# Patient Record
Sex: Female | Born: 1975 | Race: White | Hispanic: No | Marital: Married | Smoking: Former smoker
Health system: Southern US, Community
[De-identification: ages and names within clinical notes are randomized; demographics above are authoritative.]

## PROBLEM LIST (undated history)

## (undated) DIAGNOSIS — Z803 Family history of malignant neoplasm of breast: Secondary | ICD-10-CM

## (undated) DIAGNOSIS — Z923 Personal history of irradiation: Secondary | ICD-10-CM

## (undated) HISTORY — DX: Family history of malignant neoplasm of breast: Z80.3

## (undated) HISTORY — PX: WISDOM TOOTH EXTRACTION: SHX21

---

## 1998-10-05 ENCOUNTER — Inpatient Hospital Stay (HOSPITAL_COMMUNITY): Admission: AD | Admit: 1998-10-05 | Discharge: 1998-10-07 | Payer: Self-pay | Admitting: Obstetrics and Gynecology

## 1998-11-13 ENCOUNTER — Other Ambulatory Visit: Admission: RE | Admit: 1998-11-13 | Discharge: 1998-11-13 | Payer: Self-pay | Admitting: Obstetrics & Gynecology

## 1999-03-30 ENCOUNTER — Emergency Department (HOSPITAL_COMMUNITY): Admission: EM | Admit: 1999-03-30 | Discharge: 1999-03-30 | Payer: Self-pay | Admitting: *Deleted

## 1999-12-22 ENCOUNTER — Other Ambulatory Visit: Admission: RE | Admit: 1999-12-22 | Discharge: 1999-12-22 | Payer: Self-pay | Admitting: Obstetrics & Gynecology

## 2001-02-27 ENCOUNTER — Other Ambulatory Visit: Admission: RE | Admit: 2001-02-27 | Discharge: 2001-02-27 | Payer: Self-pay | Admitting: Obstetrics & Gynecology

## 2002-03-19 ENCOUNTER — Other Ambulatory Visit: Admission: RE | Admit: 2002-03-19 | Discharge: 2002-03-19 | Payer: Self-pay | Admitting: Obstetrics & Gynecology

## 2003-04-21 ENCOUNTER — Other Ambulatory Visit: Admission: RE | Admit: 2003-04-21 | Discharge: 2003-04-21 | Payer: Self-pay | Admitting: Obstetrics & Gynecology

## 2003-04-28 ENCOUNTER — Encounter: Admission: RE | Admit: 2003-04-28 | Discharge: 2003-04-28 | Payer: Self-pay | Admitting: Obstetrics & Gynecology

## 2004-06-29 ENCOUNTER — Other Ambulatory Visit: Admission: RE | Admit: 2004-06-29 | Discharge: 2004-06-29 | Payer: Self-pay | Admitting: Obstetrics & Gynecology

## 2007-12-31 ENCOUNTER — Encounter: Admission: RE | Admit: 2007-12-31 | Discharge: 2007-12-31 | Payer: Self-pay | Admitting: Obstetrics & Gynecology

## 2008-01-14 ENCOUNTER — Ambulatory Visit: Payer: Self-pay | Admitting: Pain Medicine

## 2008-09-08 ENCOUNTER — Encounter: Admission: RE | Admit: 2008-09-08 | Discharge: 2008-09-08 | Payer: Self-pay | Admitting: Obstetrics & Gynecology

## 2010-04-20 ENCOUNTER — Ambulatory Visit (HOSPITAL_COMMUNITY)
Admission: RE | Admit: 2010-04-20 | Payer: BC Managed Care – PPO | Source: Ambulatory Visit | Admitting: Obstetrics & Gynecology

## 2013-01-29 ENCOUNTER — Other Ambulatory Visit: Payer: Self-pay

## 2016-01-11 DIAGNOSIS — C50919 Malignant neoplasm of unspecified site of unspecified female breast: Secondary | ICD-10-CM

## 2016-01-11 HISTORY — DX: Malignant neoplasm of unspecified site of unspecified female breast: C50.919

## 2016-05-18 ENCOUNTER — Other Ambulatory Visit: Payer: Self-pay | Admitting: Obstetrics & Gynecology

## 2016-05-18 DIAGNOSIS — R928 Other abnormal and inconclusive findings on diagnostic imaging of breast: Secondary | ICD-10-CM

## 2016-05-23 ENCOUNTER — Ambulatory Visit
Admission: RE | Admit: 2016-05-23 | Discharge: 2016-05-23 | Disposition: A | Discharge status: Home / Self Care | Payer: BC Managed Care – PPO | Source: Ambulatory Visit | Attending: Obstetrics & Gynecology | Admitting: Obstetrics & Gynecology

## 2016-05-23 ENCOUNTER — Other Ambulatory Visit: Payer: Self-pay | Admitting: Obstetrics & Gynecology

## 2016-05-23 DIAGNOSIS — R928 Other abnormal and inconclusive findings on diagnostic imaging of breast: Secondary | ICD-10-CM

## 2016-05-23 DIAGNOSIS — N6489 Other specified disorders of breast: Secondary | ICD-10-CM

## 2016-05-25 ENCOUNTER — Telehealth: Payer: Self-pay | Admitting: *Deleted

## 2016-05-25 NOTE — Telephone Encounter (Signed)
Left vm regarding [place] for 5.23.18. Contact information provided.

## 2016-05-26 ENCOUNTER — Telehealth: Payer: Self-pay | Admitting: *Deleted

## 2016-05-26 NOTE — Telephone Encounter (Signed)
Left message for a return phone call to schedule for [facility] 5/23

## 2016-05-26 NOTE — Telephone Encounter (Signed)
Confirmed [facility] for 06/01/16 at 12:15pm .  Instructions and contact information given.

## 2016-05-31 ENCOUNTER — Other Ambulatory Visit: Payer: Self-pay | Admitting: *Deleted

## 2016-05-31 DIAGNOSIS — Z170 Estrogen receptor positive status [ER+]: Principal | ICD-10-CM

## 2016-05-31 DIAGNOSIS — C50511 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 female breast: Secondary | ICD-10-CM

## 2016-06-01 ENCOUNTER — Other Ambulatory Visit: Payer: Self-pay | Admitting: *Deleted

## 2016-06-01 ENCOUNTER — Encounter: Payer: Self-pay | Admitting: Radiation Oncology

## 2016-06-01 ENCOUNTER — Encounter: Payer: Self-pay | Admitting: Physical Therapy

## 2016-06-01 ENCOUNTER — Other Ambulatory Visit (HOSPITAL_BASED_OUTPATIENT_CLINIC_OR_DEPARTMENT_OTHER): Payer: BC Managed Care – PPO

## 2016-06-01 ENCOUNTER — Ambulatory Visit
Admission: RE | Admit: 2016-06-01 | Discharge: 2016-06-01 | Disposition: A | Discharge status: Home / Self Care | Payer: BC Managed Care – PPO | Source: Ambulatory Visit | Attending: Radiation Oncology | Admitting: Radiation Oncology

## 2016-06-01 ENCOUNTER — Ambulatory Visit: Payer: BC Managed Care – PPO | Attending: General Surgery | Admitting: Physical Therapy

## 2016-06-01 ENCOUNTER — Encounter: Payer: Self-pay | Admitting: Hematology and Oncology

## 2016-06-01 ENCOUNTER — Ambulatory Visit (HOSPITAL_BASED_OUTPATIENT_CLINIC_OR_DEPARTMENT_OTHER): Payer: BC Managed Care – PPO | Admitting: Hematology and Oncology

## 2016-06-01 DIAGNOSIS — C50511 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 female breast: Secondary | ICD-10-CM

## 2016-06-01 DIAGNOSIS — Z170 Estrogen receptor positive status [ER+]: Principal | ICD-10-CM

## 2016-06-01 DIAGNOSIS — R293 Abnormal posture: Secondary | ICD-10-CM | POA: Insufficient documentation

## 2016-06-01 LAB — CBC WITH DIFFERENTIAL/PLATELET
BASO%: 0.7 % (ref 0.0–2.0)
BASOS ABS: 0 10*3/uL (ref 0.0–0.1)
EOS ABS: 0.2 10*3/uL (ref 0.0–0.5)
EOS%: 2.5 % (ref 0.0–7.0)
HEMATOCRIT: 41.1 % (ref 34.8–46.6)
HEMOGLOBIN: 14 g/dL (ref 11.6–15.9)
LYMPH#: 1.6 10*3/uL (ref 0.9–3.3)
LYMPH%: 25.4 % (ref 14.0–49.7)
MCH: 30.6 pg (ref 25.1–34.0)
MCHC: 34.1 g/dL (ref 31.5–36.0)
MCV: 89.6 fL (ref 79.5–101.0)
MONO#: 0.5 10*3/uL (ref 0.1–0.9)
MONO%: 7.4 % (ref 0.0–14.0)
NEUT#: 4 10*3/uL (ref 1.5–6.5)
NEUT%: 64 % (ref 38.4–76.8)
PLATELETS: 233 10*3/uL (ref 145–400)
RBC: 4.59 10*6/uL (ref 3.70–5.45)
RDW: 12.9 % (ref 11.2–14.5)
WBC: 6.3 10*3/uL (ref 3.9–10.3)

## 2016-06-01 LAB — COMPREHENSIVE METABOLIC PANEL
ALBUMIN: 3.6 g/dL (ref 3.5–5.0)
ALK PHOS: 52 U/L (ref 40–150)
ALT: 13 U/L (ref 0–55)
ANION GAP: 6 meq/L (ref 3–11)
AST: 18 U/L (ref 5–34)
BUN: 11.2 mg/dL (ref 7.0–26.0)
CALCIUM: 8.9 mg/dL (ref 8.4–10.4)
CHLORIDE: 107 meq/L (ref 98–109)
CO2: 30 mEq/L — ABNORMAL HIGH (ref 22–29)
Creatinine: 1.1 mg/dL (ref 0.6–1.1)
EGFR: 59 mL/min/{1.73_m2} — AB (ref >90)
Glucose: 83 mg/dl (ref 70–140)
POTASSIUM: 4 meq/L (ref 3.5–5.1)
Sodium: 143 mEq/L (ref 136–145)
Total Bilirubin: 1.12 mg/dL (ref 0.20–1.20)
Total Protein: 7.2 g/dL (ref 6.4–8.3)

## 2016-06-01 MED ORDER — TAMOXIFEN CITRATE 20 MG PO TABS: 20.0000 mg | ORAL_TABLET | Freq: Every day | ORAL | 0 refills | Status: DC

## 2016-06-01 NOTE — Therapy (Signed)
[place], [place], [address] Phone: [phone]   Fax:  [phone]  Physical Therapy Evaluation  Patient Details  Name: Dorothy Crosby MRN: [id number] Date of Birth: 18-Jan-1975 Referring Provider: Dr. Rolm Bookbinder  Encounter Date: 06/01/2016      PT End of Session - 06/01/16 1651    Visit Number 1   Number of Visits 1   PT Start Time 6269   PT Stop Time 1505   PT Time Calculation (min) 21 min   Activity Tolerance Patient tolerated treatment well   Behavior During Therapy [facility] for tasks assessed/performed      History reviewed. No pertinent past medical history.  History reviewed. No pertinent surgical history.  There were no vitals filed for this visit.       Subjective Assessment - 06/01/16 1647    Subjective Patient reports she is here to be seen by her medical team for her newly diagnosed right breast cancer.   Patient is accompained by: Family member   Pertinent History Patient was diagnosed on 05/17/16 with right grade 2 invasive ductal carcinoma breast cancer. It measures 1.1 cm and is located in the lower outer quadrant. It is ER/PR positive and HER2 negative with a Ki67 of 10%.    Patient Stated Goals Reduce lymphedema risk and learn post op shoulder ROM HEP   Currently in Pain? No/denies            [facility] PT Assessment - 06/01/16 0001      Assessment   Medical Diagnosis Right breast cancer   Referring Provider Dr. Rolm Bookbinder   Onset Date/Surgical Date 05/17/16   Hand Dominance Right   Prior Therapy none     Precautions   Precautions Other (comment)   Precaution Comments active cancer     Restrictions   Weight Bearing Restrictions No     Balance Screen   Has the patient fallen in the past 6 months No   Has the patient had a decrease in activity level because of a fear of falling?  No   Is the patient reluctant to leave their home because of a fear of  falling?  No     Home Environment   Living Environment Private residence   Living Arrangements Spouse/significant other;Children  Husband and 17 y.o. 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   Leisure Cardio 2x/week for 30 minutes     Cognition   Overall Cognitive Status Within Functional Limits for tasks assessed     Posture/Postural Control   Posture/Postural Control Postural limitations   Postural Limitations Rounded Shoulders;Forward head     ROM / Strength   AROM / PROM / Strength AROM;Strength     AROM   AROM Assessment Site Shoulder;Cervical   Right/Left Shoulder Right;Left   Right Shoulder Extension 55 Degrees   Right Shoulder Flexion 140 Degrees   Right Shoulder ABduction 150 Degrees   Right Shoulder Internal Rotation 60 Degrees   Right Shoulder External Rotation 76 Degrees   Left Shoulder Extension 55 Degrees   Left Shoulder Flexion 146 Degrees   Left Shoulder ABduction 149 Degrees   Left Shoulder Internal Rotation 70 Degrees   Left Shoulder External Rotation 85 Degrees   Cervical Flexion WNL   Cervical Extension WNL  Cervical - Right Side Bend WNL   Cervical - Left Side Bend WNL   Cervical - Right Rotation WNL   Cervical - Left Rotation WNL     Strength   Overall Strength Within functional limits for tasks performed           LYMPHEDEMA/ONCOLOGY QUESTIONNAIRE - 06/01/16 1650      Type   Cancer Type Right breast cancer     Lymphedema Assessments   Lymphedema Assessments Upper extremities     Right Upper Extremity Lymphedema   10 cm Proximal to Olecranon Process 27.4 cm   Olecranon Process 24.3 cm   10 cm Proximal to Ulnar Styloid Process 21.5 cm   Just Proximal to Ulnar Styloid Process 15 cm   Across Hand at [employer] 18.6 cm   At [place] of 2nd Digit 6 cm      Left Upper Extremity Lymphedema   10 cm Proximal to Olecranon Process 27.5 cm   Olecranon Process 24.4 cm   10 cm Proximal to Ulnar Styloid Process 21 cm   Just Proximal to Ulnar Styloid Process 15.2 cm   Across Hand at [employer] 18.7 cm   At [place] of 2nd Digit 5.9 cm       Patient was instructed today in a home exercise program today for post op shoulder range of motion. These included active assist shoulder flexion in sitting, scapular retraction, wall walking with shoulder abduction, and hands behind head external rotation.  She was encouraged to do these twice a day, holding 3 seconds and repeating 5 times when permitted by her physician.          PT Education - 06/01/16 1651    Education provided Yes   Education Details Lymphedema risk reduction and post op shoulder ROM HEP   Person(s) Educated Patient;Spouse   Methods Explanation;Demonstration;Handout   Comprehension Returned demonstration;Verbalized understanding              Breast Clinic Goals - 06/01/16 1654      Patient will be able to verbalize understanding of pertinent lymphedema risk reduction practices relevant to her diagnosis specifically related to skin care.   Time 1   Period Days   Status Achieved     Patient will be able to return demonstrate and/or verbalize understanding of the post-op home exercise program related to regaining shoulder range of motion.   Time 1   Period Days   Status Achieved     Patient will be able to verbalize understanding of the importance of attending the postoperative After Breast Cancer Class for further lymphedema risk reduction education and therapeutic exercise.   Time 1   Period Days   Status Achieved              Plan - 06/01/16 1651    Clinical Impression Statement Patient was diagnosed on 05/17/16 with right grade 2 invasive ductal carcinoma breast cancer. It measures 1.1 cm and is located in the lower outer quadrant. It is ER/PR positive and  HER2 negative with a Ki67 of 10%.  Her multidisciplinary medical team met prior to her assessments to determine a recommended treatment plan. She is planning to have genetic testing, a right lumpectomy and sentinel node biopsy, Oncotype testing, radiation, and anti-estrogen therapy. She may benefit from post op PT to regain shoulder ROM and reduce lymphedema risk. Due to her lack of comorbidities, her eval is of low complexity.   Rehab Potential Excellent   Clinical Impairments  Affecting Rehab Potential None   PT Frequency One time visit   PT Treatment/Interventions Patient/family education;Therapeutic exercise   PT Next Visit Plan Will f/u after surgery to determine PT needs   PT Home Exercise Plan Post op shoulder ROM HEP   Consulted and Agree with Plan of Care Patient;Family member/caregiver   Family Member Consulted Husband      Patient will benefit from skilled therapeutic intervention in order to improve the following deficits and impairments:  Postural dysfunction, Decreased knowledge of precautions, Pain, Impaired UE functional use, Decreased range of motion  Visit Diagnosis: Carcinoma of lower-outer quadrant of right breast in female, estrogen receptor positive ([place]) - Plan: PT plan of care cert/re-cert  Abnormal posture - Plan: PT plan of care cert/re-cert   Patient will follow up at outpatient cancer rehab if needed following surgery.  If the patient requires physical therapy at that time, a specific plan will be dictated and sent to the referring physician for approval. The patient was educated today on appropriate basic range of motion exercises to begin post operatively and the importance of attending the After Breast Cancer class following surgery.  Patient was educated today on lymphedema risk reduction practices as it pertains to recommendations that will benefit the patient immediately following surgery.  She verbalized good understanding.  No additional physical therapy is  indicated at this time.     Problem List Patient Active Problem List   Diagnosis Date Noted  . 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 breast of female, estrogen receptor positive ([place]) 05/31/2016     Annia Friendly, PT 06/01/16 4:55 PM  [place], [place], [address] Phone: [phone]   Fax:  [phone]  Name: [facility] MRN: [id number] Date of Birth: 1975-05-11

## 2016-06-01 NOTE — Patient Instructions (Signed)

## 2016-06-01 NOTE — Assessment & Plan Note (Signed)
05/23/2016: Screening detected right breast distortion and left breast mass (fibroglandular tissue six-month follow-up recommended); ultrasound 1.1 cm right breast biopsy: Grade 2 invasive ductal carcinoma with DCIS, perineural invasion present, ER 70%, PR 90%, Ki-67 10%, HER-2 negative ratio 1.45, T1 BN 0 stage I a clinical stage  Pathology and radiology counseling:Discussed with the patient, the details of pathology including the type of breast cancer,the clinical staging, the significance of ER, PR and HER-2/neu receptors and the implications for treatment. After reviewing the pathology in detail, we proceeded to discuss the different treatment options between surgery, radiation, chemotherapy, antiestrogen therapies.  Recommendations: 1. Breast conserving surgery followed by 2. Oncotype DX testing to determine if chemotherapy would be of any benefit followed by 3. Adjuvant radiation therapy followed by 4. Adjuvant antiestrogen therapy  Oncotype counseling: I discussed Oncotype DX test. I explained to the patient that this is a 21 gene panel to evaluate patient tumors DNA to calculate recurrence score. This would help determine whether patient has high risk or intermediate risk or low risk breast cancer. She understands that if her tumor was found to be high risk, she would benefit from systemic chemotherapy. If low risk, no need of chemotherapy. If she was found to be intermediate risk, we would need to evaluate the score as well as other risk factors and determine if an abbreviated chemotherapy may be of benefit.  Return to clinic after surgery to discuss final pathology report and then determine if Oncotype DX testing will need to be sent.

## 2016-06-01 NOTE — Progress Notes (Signed)
Radiation Oncology         [phone] ________________________________  Initial Outpatient Consultation  Name: Dorothy Crosby [facility] MRN: [id number]  Date: 06/01/2016  DOB: 03/16/1975  HU:[id number], Audree Camel, MD  Rolm Bookbinder, MD   REFERRING PHYSICIAN: Rolm Bookbinder, MD  DIAGNOSIS: The encounter diagnosis was 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 breast of female, estrogen receptor positive ([place]).  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 female who was found to have right breast distortion and left breast mass (felt to be benign) on screening mammogram at the [place].  Biopsy of the right breast in the 6:30 o'clock position on 05/23/16 showed invasive mammary carcinoma with perineural invasion, grade 2, spanning 1.1 x 1.1 x 1.0 cm.  On review of systems, the patient is positive for fatigue, gum disease, poor circulation, joint pain, headaches, forgetfulness, and unspecified phobias.  PREVIOUS RADIATION THERAPY: No  PAST MEDICAL HISTORY:  has no past medical history on file.    PAST SURGICAL HISTORY:No past surgical history on file.  FAMILY HISTORY: family history includes Cancer in her maternal grandmother.  SOCIAL HISTORY:  reports that she quit smoking about 3 years ago. Her smoking use included Cigarettes. She smoked 1.00 pack per day. She has never used smokeless tobacco. She reports that she drinks alcohol. She reports that she does not use drugs.  ALLERGIES: Patient has no known allergies.  MEDICATIONS:  Current Outpatient Prescriptions  Medication Sig Dispense Refill  . BIOTIN 5000 PO Take 1 tablet by mouth daily.    . Lactobacillus (PROBIOTIC ACIDOPHILUS PO) Take 1 tablet by mouth daily.    . Multiple Vitamin (MULTIVITAMIN) tablet Take 1 tablet by mouth daily.    . tamoxifen (NOLVADEX) 20 MG tablet Take 1 tablet (20 mg total) by  mouth daily. 30 tablet 0   No current facility-administered medications for this encounter.     REVIEW OF SYSTEMS:  A 10+ POINT REVIEW OF SYSTEMS WAS OBTAINED including neurology, dermatology, psychiatry, cardiac, respiratory, lymph, extremities, GI, GU, musculoskeletal, constitutional, reproductive, HEENT. All pertinent positives are noted in the HPI. All others are negative.   PHYSICAL EXAM:  vitals were not taken for this visit.  Vitals with BMI 06/01/2016  Height 5' 4.5"  Weight 152 lbs 2 oz  BMI 35.4  Systolic 656  Diastolic 54  Pulse 89  Respirations 18  Lungs are clear to auscultation bilaterally. Heart has regular rate and rhythm. No palpable cervical, supraclavicular, or axillary adenopathy.  Left breast shows no palpable mass or nipple discharge. Right breast has some mild bruising in the lower aspect of the breast and a small biopsy site in the 5:30-6:00 o'clock position. No palpable mass or nipple discharge.   ECOG = 1   LABORATORY DATA:  Lab Results  Component Value Date   WBC 6.3 06/01/2016   HGB 14.0 06/01/2016   HCT 41.1 06/01/2016   MCV 89.6 06/01/2016   PLT 233 06/01/2016   NEUTROABS 4.0 06/01/2016   Lab Results  Component Value Date   NA 143 06/01/2016   K 4.0 06/01/2016   CO2 30 (H) 06/01/2016   GLUCOSE 83 06/01/2016   CREATININE 1.1 06/01/2016   CALCIUM 8.9 06/01/2016      RADIOGRAPHY: US Breast Ltd Uni Left Inc Axilla  Result Date: 05/23/2016 CLINICAL DATA:  41 year old patient recalled from recent screening mammogram for possible architectural distortion in  the right breast and possible mass in the left breast. She had a left breast ultrasound-guided biopsy December 2009, demonstrating benign breast parenchyma with stromal fibrosis. She has never had a surgical breast biopsy. History of breast cancer in her maternal grandmother. EXAM: 2D DIGITAL DIAGNOSTIC BILATERAL MAMMOGRAM WITH CAD AND ADJUNCT TOMO ULTRASOUND BILATERAL BREAST COMPARISON:  May 12, 2015 and earlier exams ACR Breast Density Category d: The breast tissue is extremely dense, which lowers the sensitivity of mammography. FINDINGS: On additional imaging of the left breast, an [place] of probable dense fibroglandular tissue seen in the medial left breast without a discrete mass or architectural distortion. In the right breast, there is persistent architectural distortion best seen in the CC projection, slightly lateral to the nipple, shown on tomography to be in the lower outer quadrant. On a 90 degree lateral view of the right breast with tomography, no definite architectural distortion or mass is appreciated. Mammographic images were processed with CAD. On physical exam, I do not palpated a mass in the medial left breast. In the lower outer quadrant of the right breast, I palpate a prominent ridge versus mass at approximately 6 o'clock to 6:30 position 2-3 cm from the nipple. Targeted ultrasound is performed, showing dense fibroglandular tissue in the medial left breast without focal suspicious mass or abnormal shadowing. Ultrasound of the right breast reveals an irregular, taller than wide, hypoechoic mass with associated vascular flow at 6:30 position 3 cm from the nipple. This mass measures 1.0 x 1.1 x 1.1 cm. Malignancy cannot be excluded. This mass is likely the cause of the architectural distortion seen on the mammogram. Ultrasound of the right axilla is negative for lymphadenopathy. IMPRESSION: Suspicious mass in the lower outer quadrant of the right breast. Probably benign dense fibroglandular tissue in the far medial left breast. RECOMMENDATION: 1. Ultrasound-guided biopsy of the right breast mass is recommended and will be performed later today and dictated separately. 2. Diagnostic left mammogram and possible left breast ultrasound is recommended in 6 months. I have discussed the findings and recommendations with the patient. Results were also provided in writing at the conclusion of the  visit. If applicable, a reminder letter will be sent to the patient regarding the next appointment. BI-RADS CATEGORY  4: Suspicious. Electronically Signed   By: Curlene Dolphin M.D.   On: 05/23/2016 11:07   US Breast Ltd Uni Right Inc Axilla  Result Date: 05/23/2016 CLINICAL DATA:  41 year old patient recalled from recent screening mammogram for possible architectural distortion in the right breast and possible mass in the left breast. She had a left breast ultrasound-guided biopsy December 2009, demonstrating benign breast parenchyma with stromal fibrosis. She has never had a surgical breast biopsy. History of breast cancer in her maternal grandmother. EXAM: 2D DIGITAL DIAGNOSTIC BILATERAL MAMMOGRAM WITH CAD AND ADJUNCT TOMO ULTRASOUND BILATERAL BREAST COMPARISON:  May 12, 2015 and earlier exams ACR Breast Density Category d: The breast tissue is extremely dense, which lowers the sensitivity of mammography. FINDINGS: On additional imaging of the left breast, an [place] of probable dense fibroglandular tissue seen in the medial left breast without a discrete mass or architectural distortion. In the right breast, there is persistent architectural distortion best seen in the CC projection, slightly lateral to the nipple, shown on tomography to be in the lower outer quadrant. On a 90 degree lateral view of the right breast with tomography, no definite architectural distortion or mass is appreciated. Mammographic images were processed with CAD. On physical exam, I  do not palpated a mass in the medial left breast. In the lower outer quadrant of the right breast, I palpate a prominent ridge versus mass at approximately 6 o'clock to 6:30 position 2-3 cm from the nipple. Targeted ultrasound is performed, showing dense fibroglandular tissue in the medial left breast without focal suspicious mass or abnormal shadowing. Ultrasound of the right breast reveals an irregular, taller than wide, hypoechoic mass with associated  vascular flow at 6:30 position 3 cm from the nipple. This mass measures 1.0 x 1.1 x 1.1 cm. Malignancy cannot be excluded. This mass is likely the cause of the architectural distortion seen on the mammogram. Ultrasound of the right axilla is negative for lymphadenopathy. IMPRESSION: Suspicious mass in the lower outer quadrant of the right breast. Probably benign dense fibroglandular tissue in the far medial left breast. RECOMMENDATION: 1. Ultrasound-guided biopsy of the right breast mass is recommended and will be performed later today and dictated separately. 2. Diagnostic left mammogram and possible left breast ultrasound is recommended in 6 months. I have discussed the findings and recommendations with the patient. Results were also provided in writing at the conclusion of the visit. If applicable, a reminder letter will be sent to the patient regarding the next appointment. BI-RADS CATEGORY  4: Suspicious. Electronically Signed   By: Curlene Dolphin M.D.   On: 05/23/2016 11:07   Mm Diag Breast Tomo Bilateral  Result Date: 05/23/2016 CLINICAL DATA:  41 year old patient recalled from recent screening mammogram for possible architectural distortion in the right breast and possible mass in the left breast. She had a left breast ultrasound-guided biopsy December 2009, demonstrating benign breast parenchyma with stromal fibrosis. She has never had a surgical breast biopsy. History of breast cancer in her maternal grandmother. EXAM: 2D DIGITAL DIAGNOSTIC BILATERAL MAMMOGRAM WITH CAD AND ADJUNCT TOMO ULTRASOUND BILATERAL BREAST COMPARISON:  May 12, 2015 and earlier exams ACR Breast Density Category d: The breast tissue is extremely dense, which lowers the sensitivity of mammography. FINDINGS: On additional imaging of the left breast, an [place] of probable dense fibroglandular tissue seen in the medial left breast without a discrete mass or architectural distortion. In the right breast, there is persistent architectural  distortion best seen in the CC projection, slightly lateral to the nipple, shown on tomography to be in the lower outer quadrant. On a 90 degree lateral view of the right breast with tomography, no definite architectural distortion or mass is appreciated. Mammographic images were processed with CAD. On physical exam, I do not palpated a mass in the medial left breast. In the lower outer quadrant of the right breast, I palpate a prominent ridge versus mass at approximately 6 o'clock to 6:30 position 2-3 cm from the nipple. Targeted ultrasound is performed, showing dense fibroglandular tissue in the medial left breast without focal suspicious mass or abnormal shadowing. Ultrasound of the right breast reveals an irregular, taller than wide, hypoechoic mass with associated vascular flow at 6:30 position 3 cm from the nipple. This mass measures 1.0 x 1.1 x 1.1 cm. Malignancy cannot be excluded. This mass is likely the cause of the architectural distortion seen on the mammogram. Ultrasound of the right axilla is negative for lymphadenopathy. IMPRESSION: Suspicious mass in the lower outer quadrant of the right breast. Probably benign dense fibroglandular tissue in the far medial left breast. RECOMMENDATION: 1. Ultrasound-guided biopsy of the right breast mass is recommended and will be performed later today and dictated separately. 2. Diagnostic left mammogram and possible left breast ultrasound  is recommended in 6 months. I have discussed the findings and recommendations with the patient. Results were also provided in writing at the conclusion of the visit. If applicable, a reminder letter will be sent to the patient regarding the next appointment. BI-RADS CATEGORY  4: Suspicious. Electronically Signed   By: Curlene Dolphin M.D.   On: 05/23/2016 11:07   Mm Clip Placement Right  Result Date: 05/23/2016 CLINICAL DATA:  Post biopsy mammogram of the right breast for clip placement. EXAM: DIAGNOSTIC RIGHT MAMMOGRAM POST  ULTRASOUND BIOPSY COMPARISON:  Previous exam(s). FINDINGS: Mammographic images were obtained following ultrasound guided biopsy of right breast mass at 6:30. The ribbon shaped biopsy marking clip is appropriately positioned at the site of the biopsied distorted mass at 6:30 in the right breast. IMPRESSION: Appropriate positioning of the ribbon shaped biopsy marking clip in the lower slightly outer quadrant of the right breast. Final Assessment: Post Procedure Mammograms for Marker Placement Electronically Signed   By: Ammie Ferrier M.D.   On: 05/23/2016 14:58   [place] Rt Breast Bx W Loc Dev 1st Lesion Img Bx Spec US Guide  Addendum Date: 05/25/2016   ADDENDUM REPORT: 05/25/2016 08:07 ADDENDUM: Pathology revealed grade II invasive ductal carcinoma with perineural invasion in the right breast. This was found to be concordant by Dr. Ammie Ferrier. Pathology results were discussed with the patient by telephone. The patient reported doing well after the biopsy. Post biopsy instructions and care were reviewed and questions were answered. The patient was encouraged to call [facility] for any additional concerns. The patient was referred to the [facility] at the [facility] on Jun 01, 2016. A bilateral breast MRI is recommended secondary to dense breast parenchyma in this patient with premenopausal cancer. Pathology results reported by Susa Raring RN, BSN on 05/25/2016. Electronically Signed   By: Ammie Ferrier M.D.   On: 05/25/2016 08:07   Result Date: 05/25/2016 CLINICAL DATA:  41 year old female presenting for ultrasound-guided biopsy of a right breast mass. EXAM: ULTRASOUND GUIDED RIGHT BREAST CORE NEEDLE BIOPSY COMPARISON:  Previous exam(s). FINDINGS: I met with the patient and we discussed the procedure of ultrasound-guided biopsy, including benefits and alternatives. We discussed the high likelihood of a successful procedure.  We discussed the risks of the procedure, including infection, bleeding, tissue injury, clip migration, and inadequate sampling. Informed written consent was given. The usual time-out protocol was performed immediately prior to the procedure. Lesion quadrant: Lower outer Using sterile technique and 1% Lidocaine as local anesthetic, under direct ultrasound visualization, a 14 gauge spring-loaded device was used to perform biopsy of a right breast mass at 6:30 using a medial approach. At the conclusion of the procedure a ribbon shaped tissue marker clip was deployed into the biopsy cavity. Follow up 2 view mammogram was performed and dictated separately. IMPRESSION: Ultrasound guided biopsy of right breast mass at 6:30. No apparent complications. Electronically Signed: By: Ammie Ferrier M.D. On: 05/23/2016 14:30      IMPRESSION: This is a 41 y.o. woman with Clinical stage T1c invasive ductal carcinoma of the right breast, grade 2. Patient is a good candidate for breast conservation therapy with radiation therapy directed at the right breast. I discussed the course of treatment, side effects, and potential toxicities of radiation therapy with the patient and her husband. She will likely receive her radiation therapy at the [facility] given her close proximity to this facility.  PLAN:  In light  of her family history and age she wishes to await results of genetic testing prior to deciding her surgical approach. At a later date the patient will be placed on adjuvant hormonal therapy with Tamoxifen. She will have an MRI for further evaluation.   ------------------------------------------------  Blair Promise, PhD, MD  This document serves as a record of services personally performed by Gery Pray, MD. It was created on his behalf by Maryla Morrow, a trained medical scribe. The creation of this record is based on the scribe's personal observations and the provider's statements to them. This  document has been checked and approved by the attending provider.

## 2016-06-01 NOTE — Progress Notes (Signed)
[facility] CONSULT NOTE  Patient Care Team: Hadley Pen, MD as PCP - General (Family Medicine) Emelia Loron, MD as Consulting Physician (General Surgery) Serena Croissant, MD as Consulting Physician (Hematology and Oncology) Antony Blackbird, MD as Consulting Physician (Radiation Oncology)  CHIEF COMPLAINTS/PURPOSE OF CONSULTATION:  Newly diagnosed breast cancer  HISTORY OF PRESENTING ILLNESS:  Dorothy Crosby 41 y.o. female is here because of recent diagnosis of right breast cancer. Patient has screening mammogram that revealed right breast distortion along with left breast mass which turned out to be fibroglandular changes. Left breast lesions will need to be followed by a six-month mammogram. The right breast distortion measures 1.1 cm by ultrasound. Biopsy future revealed grade 2 invasive ductal carcinoma with DCIS and perineural invasion that was ER/PR positive and HER-2 negative. She was presented this morning of the margins benign tumor board and she is here today to discuss the treatment plan.  I reviewed her records extensively and collaborated the history with the patient.  SUMMARY OF ONCOLOGIC HISTORY:   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 breast of female, estrogen receptor positive ([facility])   05/23/2016 Initial Diagnosis    Screening detected right breast distortion and left breast mass (fibroglandular tissue six-month follow-up recommended); ultrasound 1.1 cm right breast biopsy: Grade 2 invasive ductal carcinoma with DCIS, perineural invasion present, ER 70%, PR 90%, Ki-67 10%, HER-2 negative ratio 1.45, T1 BN 0 stage I a clinical stage       MEDICAL HISTORY:  No previous medical problems SURGICAL HISTORY: No prior surgeries SOCIAL HISTORY: Social History   Social History  . Marital status: Married    Spouse name: N/A  . Number of children: N/A  . Years of education: N/A   Occupational History  . Not on file.   Social History  Main Topics  . Smoking status: Former Smoker    Packs/day: 1.00    Types: Cigarettes    Quit date: 07/14/2012  . Smokeless tobacco: Not on file  . Alcohol use Yes  . Drug use: No  . Sexual activity: Not on file   Other Topics Concern  . Not on file   Social History Narrative  . No narrative on file    FAMILY HISTORY: Family History  Problem Relation Age of Onset  . Cancer Maternal Grandmother        breast    ALLERGIES:  has No Known Allergies.  MEDICATIONS:  Current Outpatient Prescriptions  Medication Sig Dispense Refill  . BIOTIN 5000 PO Take 1 tablet by mouth daily.    . Lactobacillus (PROBIOTIC ACIDOPHILUS PO) Take 1 tablet by mouth daily.    . Multiple Vitamin (MULTIVITAMIN) tablet Take 1 tablet by mouth daily.    . tamoxifen (NOLVADEX) 20 MG tablet Take 1 tablet (20 mg total) by mouth daily. 30 tablet 0   No current facility-administered medications for this visit.     REVIEW OF SYSTEMS:   Constitutional: Denies fevers, chills or abnormal night sweats Eyes: Denies blurriness of vision, double vision or watery eyes Ears, nose, mouth, throat, and face: Denies mucositis or sore throat Respiratory: Denies cough, dyspnea or wheezes Cardiovascular: Denies palpitation, chest discomfort or lower extremity swelling Gastrointestinal:  Denies nausea, heartburn or change in bowel habits Skin: Denies abnormal skin rashes Lymphatics: Denies new lymphadenopathy or easy bruising Neurological:Denies numbness, tingling or new weaknesses Behavioral/Psych: Mood is stable, no new changes  Breast:  Denies any palpable lumps or discharge All other systems were reviewed with  the patient and are negative.  PHYSICAL EXAMINATION: ECOG PERFORMANCE STATUS: 0 - Asymptomatic  Vitals:   06/01/16 1302  BP: (!) 102/54  Pulse: 89  Resp: 18  Temp: 97.6 F (36.4 C)   Filed Weights   06/01/16 1302  Weight: 152 lb 1.6 oz (69 kg)    GENERAL:alert, no distress and comfortable SKIN:  skin color, texture, turgor are normal, no rashes or significant lesions EYES: normal, conjunctiva are pink and non-injected, sclera clear OROPHARYNX:no exudate, no erythema and lips, buccal mucosa, and tongue normal  NECK: supple, thyroid normal size, non-tender, without nodularity LYMPH:  no palpable lymphadenopathy in the cervical, axillary or inguinal LUNGS: clear to auscultation and percussion with normal breathing effort HEART: regular rate & rhythm and no murmurs and no lower extremity edema ABDOMEN:abdomen soft, non-tender and normal bowel sounds Musculoskeletal:no cyanosis of digits and no clubbing  PSYCH: alert & oriented x 3 with fluent speech NEURO: no focal motor/sensory deficits BREAST: No palpable nodules in breast. No palpable axillary or supraclavicular lymphadenopathy (exam performed in the presence of a chaperone)   LABORATORY DATA:  I have reviewed the data as listed Lab Results  Component Value Date   WBC 6.3 06/01/2016   HGB 14.0 06/01/2016   HCT 41.1 06/01/2016   MCV 89.6 06/01/2016   PLT 233 06/01/2016   Lab Results  Component Value Date   NA 143 06/01/2016   K 4.0 06/01/2016   CO2 30 (H) 06/01/2016    RADIOGRAPHIC STUDIES: I have personally reviewed the radiological reports and agreed with the findings in the report.  ASSESSMENT AND PLAN:  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 breast of female, estrogen receptor positive ([place]) 05/23/2016: Screening detected right breast distortion and left breast mass (fibroglandular tissue six-month follow-up recommended); ultrasound 1.1 cm right breast biopsy: Grade 2 invasive ductal carcinoma with DCIS, perineural invasion present, ER 70%, PR 90%, Ki-67 10%, HER-2 negative ratio 1.45, T1 BN 0 stage I a clinical stage  Pathology and radiology counseling:Discussed with the patient, the details of pathology including the type of breast cancer,the clinical staging, the significance of ER, PR and HER-2/neu  receptors and the implications for treatment. After reviewing the pathology in detail, we proceeded to discuss the different treatment options between surgery, radiation, chemotherapy, antiestrogen therapies.  Recommendations: 1. Breast conserving surgery followed by 2. Oncotype DX testing to determine if chemotherapy would be of any benefit followed by 3. Adjuvant radiation therapy followed by 4. Adjuvant antiestrogen therapy  Oncotype counseling: I discussed Oncotype DX test. I explained to the patient that this is a 21 gene panel to evaluate patient tumors DNA to calculate recurrence score. This would help determine whether patient has high risk or intermediate risk or low risk breast cancer. She understands that if her tumor was found to be high risk, she would benefit from systemic chemotherapy. If low risk, no need of chemotherapy. If she was found to be intermediate risk, we would need to evaluate the score as well as other risk factors and determine if an abbreviated chemotherapy may be of benefit.  Return to clinic after surgery to discuss final pathology report and then determine if Oncotype DX testing will need to be sent.     All questions were answered. The patient knows to call the clinic with any problems, questions or concerns.    Rulon Eisenmenger, MD 06/01/16

## 2016-06-02 ENCOUNTER — Encounter: Payer: Self-pay | Admitting: Genetic Counselor

## 2016-06-02 ENCOUNTER — Other Ambulatory Visit: Payer: BC Managed Care – PPO

## 2016-06-02 ENCOUNTER — Ambulatory Visit (HOSPITAL_BASED_OUTPATIENT_CLINIC_OR_DEPARTMENT_OTHER): Payer: BC Managed Care – PPO | Admitting: Genetic Counselor

## 2016-06-02 ENCOUNTER — Encounter: Payer: Self-pay | Admitting: General Practice

## 2016-06-02 DIAGNOSIS — C50511 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 female breast: Secondary | ICD-10-CM

## 2016-06-02 DIAGNOSIS — Z170 Estrogen receptor positive status [ER+]: Secondary | ICD-10-CM

## 2016-06-02 DIAGNOSIS — Z803 Family history of malignant neoplasm of breast: Secondary | ICD-10-CM | POA: Insufficient documentation

## 2016-06-02 NOTE — Progress Notes (Signed)
REFERRING PROVIDER: Nicholas Lose, MD [address] [place], [place] [address]  PRIMARY PROVIDER:  Myrlene Broker, MD  PRIMARY REASON FOR VISIT:  1. 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 breast of female, estrogen receptor positive ([place])   2. Family history of breast cancer      HISTORY OF PRESENT ILLNESS:   [name], a 41 y.o. female, was seen for a [facility] cancer genetics consultation at the request of Dr. Lindi Adie due to a personal and family history of cancer.  [name] presents to clinic today to discuss the possibility of a hereditary predisposition to cancer, genetic testing, and to further clarify her future cancer risks, as well as potential cancer risks for family members.   In May 2018, at the age of 89, [name] was diagnosed with invasive ductal carcinoma of the right breast. This is being treated with tamoxifen, and she will be scheduled for surgery and radiation.  Chemotherapy will be dependant on an oncotype and genetic testing results.     CANCER HISTORY:    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 breast of female, estrogen receptor positive ([place])   05/23/2016 Initial Diagnosis    Screening detected right breast distortion and left breast mass (fibroglandular tissue six-month follow-up recommended); ultrasound 1.1 cm right breast biopsy: Grade 2 invasive ductal carcinoma with DCIS, perineural invasion present, ER 70%, PR 90%, Ki-67 10%, HER-2 negative ratio 1.45, T1 BN 0 stage I a clinical stage        HORMONAL RISK FACTORS:  Menarche was at age 59.  First live birth at age 14.  OCP use for approximately 21 years.  Ovaries intact: yes.  Hysterectomy: no.  Menopausal status: premenopausal.  HRT use: 0 years. Colonoscopy: no; not examined. Mammogram within the last year: yes. Number of breast biopsies: 2. Up to date with pelvic exams:  yes. Any excessive radiation exposure in the past:  no  Past Medical  History:  Diagnosis Date  . Family history of breast cancer     History reviewed. No pertinent surgical history.  Social History   Social History  . Marital status: Married    Spouse name: N/A  . Number of children: N/A  . Years of education: N/A   Social History Main Topics  . Smoking status: Former Smoker    Packs/day: 1.00    Types: Cigarettes    Quit date: 07/14/2012  . Smokeless tobacco: Never Used  . Alcohol use Yes  . Drug use: No  . Sexual activity: Not Asked   Other Topics Concern  . None   Social History Narrative  . None     FAMILY HISTORY:  We obtained a detailed, 4-generation family history.  Significant diagnoses are listed below: Family History  Problem Relation Age of Onset  . Cancer Maternal Grandmother        breast  . Melanoma Maternal Uncle        exposed to agent orange  . Heart attack Maternal Grandfather   . COPD Paternal Grandmother   . Heart disease Paternal Grandfather   . Lung cancer Maternal Uncle   . Lupus Cousin        maternal first cousin    The patient has one daughter who is cancer free.  She has two paternal half brothers, both who are cancer free.  She does not have close contact with her biological father, but reports that he does not have cancer, and that he  had three brothers and a sister.  One brother died of alcohol abuse and one from HIV, but she is not aware of a cancer diagnosis.    Her mother is alive and well.  She had eight brothers and one sister.  One brother was exposed to agent orange and developed melanoma.  Another brother is a smoker and had lung cancer.  The patient's maternal grandmother had breast cancer in her early 86's and died of heart disease.  Her grandfather died of a heart attack.  [name] is unaware of previous family history of genetic testing for hereditary cancer risks. Patient's maternal ancestors are of English descent, and paternal ancestors are of Caucasian descent. There is no reported  Ashkenazi Jewish ancestry. There is no known consanguinity.  GENETIC COUNSELING ASSESSMENT: [name] is a 41 y.o. female with a personal and family history of breast cancer which is somewhat suggestive of a hereditary cancer syndrome and predisposition to cancer. We, therefore, discussed and recommended the following at today's visit.   DISCUSSION: We discussed that about 5-10% of breast cancer is hereditary with most cases due to BRCA mutations.  We discussed that other genes are also associated with a moderate risk for breast cancer.  These include the ATM, CHEK2 and PALB2 genes.  We reviewed the characteristics, features and inheritance patterns of hereditary cancer syndromes. We also discussed genetic testing, including the appropriate family members to test, the process of testing, insurance coverage and turn-around-time for results.  We discussed the implications of a negative, positive and/or variant of uncertain significant result. In order to get genetic test results in a timely manner so that [name] can use these genetic test results for surgical decisions, we recommended [name] pursue genetic testing for the STAT 9-gene test. If this test is negative, we then recommend Ms. Lucianne Lei Hoose pursue reflex genetic testing to the common hereditary cancer gene panel. The Hereditary Gene Panel offered by [facility] includes sequencing and/or deletion duplication testing of the following 46 genes: APC, ATM, AXIN2, BARD1, BMPR1A, BRCA1, BRCA2, BRIP1, CDH1, CDKN2A (p14ARF), CDKN2A (p16INK4a), CHEK2, CTNNA1, DICER1, EPCAM (Deletion/duplication testing only), GREM1 (promoter region deletion/duplication testing only), KIT, MEN1, MLH1, MSH2, MSH3, MSH6, MUTYH, NBN, NF1, NHTL1, PALB2, PDGFRA, PMS2, POLD1, POLE, PTEN, RAD50, RAD51C, RAD51D, SDHB, SDHC, SDHD, SMAD4, SMARCA4. STK11, TP53, TSC1, TSC2, and VHL.  The following genes were evaluated for sequence changes only: SDHA and HOXB13 c.251G>A variant  only.  Based on Ms. Lucianne Lei Hoose's personal and family history of cancer, she meets medical criteria for genetic testing. Despite that she meets criteria, she may still have an out of pocket cost. We discussed that if her out of pocket cost for testing is over $100, the laboratory will call and confirm whether she wants to proceed with testing.  If the out of pocket cost of testing is less than $100 she will be billed by the genetic testing laboratory.   PLAN: After considering the risks, benefits, and limitations, [name]  provided informed consent to pursue genetic testing and the blood sample was sent to [facility] for analysis of the STAT 9 gene panel.  We will reflex to the common hereditary cancer panel. Results should be available within approximately 7-10 days for the STAT panel and an additional 2-3 weeks' time for the remainder of testing, at which point they will be disclosed by telephone to Ms. [facility], as will any additional recommendations warranted by these results. Ms. Lucianne Lei  Hoose will receive a summary of her genetic counseling visit and a copy of her results once available. This information will also be available in Epic. We encouraged Ms. Lucianne Lei Hoose to remain in contact with cancer genetics annually so that we can continuously update the family history and inform her of any changes in cancer genetics and testing that may be of benefit for her family. Ms. Lucianne Lei Hoose's questions were answered to her satisfaction today. Our contact information was provided should additional questions or concerns arise.  Lastly, we encouraged Ms. Lucianne Lei Hoose to remain in contact with cancer genetics annually so that we can continuously update the family history and inform her of any changes in cancer genetics and testing that may be of benefit for this family.   Ms.  Lucianne Lei Hoose's questions were answered to her satisfaction today. Our contact information was provided should additional questions or  concerns arise. Thank you for the referral and allowing [place] to share in the care of your patient.   Karen P. Florene Glen, [place], [facility] Certified Genetic Counselor [email or web address] phone: [phone]  The patient was seen for a total of 35 minutes in face-to-face genetic counseling.  This patient was discussed with Drs. Magrinat, Lindi Adie and/or Burr Medico who agrees with the above.    _______________________________________________________________________ For Office Staff:  Number of people involved in session: 1 Was an Intern/ student involved with case: no

## 2016-06-02 NOTE — Progress Notes (Signed)
[facility] Psychosocial Distress Screening Spiritual Care  Shadowed by counseling intern Becca Cash/LP[facility], [place], met with Dorothy Crosby and her husband in [facility] to introduce [facility] team/resources, reviewing distress screen per protocol.  The patient scored a 5 on the Psychosocial Distress Thermometer which indicates moderate distress. Also assessed for distress and other psychosocial needs.   ONCBCN DISTRESS SCREENING 06/02/2016  Screening Type Initial Screening  Distress experienced in past week (1-10) 5  Emotional problem type Adjusting to illness;Adjusting to appearance changes  Information Concerns Type Lack of info about diagnosis;Lack of info about treatment;Lack of info about complementary therapy choices  Physical Problem type Changes in urination  Referral to support programs Yes   Dorothy Crosby used this encounter to process her recent stress and some relief at meeting her team and learning about scope of dx.  Per pt, even with more steps yet to assess full need and solidify tx plan, [facility] was helpful.  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  We talked about [facility] team and programming resources as means for building some community and pursuing interests that nurture and engage her.   Follow up needed: No. Per couple, no other needs at this time. They have full packet of [place] and know to call with any questions or concerns, but please also page if immediate needs arise. Thank you.   [place], [place], [facility] Pager [phone] Voicemail [phone]

## 2016-06-08 ENCOUNTER — Telehealth: Payer: Self-pay | Admitting: *Deleted

## 2016-06-08 ENCOUNTER — Ambulatory Visit
Admission: RE | Admit: 2016-06-08 | Discharge: 2016-06-08 | Disposition: A | Discharge status: Home / Self Care | Payer: BC Managed Care – PPO | Source: Ambulatory Visit | Attending: General Surgery | Admitting: General Surgery

## 2016-06-08 DIAGNOSIS — Z170 Estrogen receptor positive status [ER+]: Principal | ICD-10-CM

## 2016-06-08 DIAGNOSIS — C50511 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 female breast: Secondary | ICD-10-CM

## 2016-06-08 MED ORDER — GADOBENATE DIMEGLUMINE 529 MG/ML IV SOLN
15.0000 mL | Freq: Once | INTRAVENOUS | Status: AC | PRN
  Administered 2016-06-08: 14 mL via INTRAVENOUS

## 2016-06-08 NOTE — Telephone Encounter (Signed)
Left message to follow up from [facility] 5/23.

## 2016-06-13 ENCOUNTER — Telehealth: Payer: Self-pay | Admitting: Genetic Counselor

## 2016-06-13 ENCOUNTER — Other Ambulatory Visit: Payer: Self-pay | Admitting: General Surgery

## 2016-06-13 ENCOUNTER — Encounter: Payer: Self-pay | Admitting: Genetic Counselor

## 2016-06-13 DIAGNOSIS — R928 Other abnormal and inconclusive findings on diagnostic imaging of breast: Secondary | ICD-10-CM

## 2016-06-13 DIAGNOSIS — Z1379 Encounter for other screening for genetic and chromosomal anomalies: Secondary | ICD-10-CM | POA: Insufficient documentation

## 2016-06-13 NOTE — Telephone Encounter (Signed)
LM on VM with good news.  Left CB instructions.

## 2016-06-14 ENCOUNTER — Telehealth: Payer: Self-pay | Admitting: Genetic Counselor

## 2016-06-14 NOTE — Telephone Encounter (Signed)
LM on VM that I called and to please CB.

## 2016-06-14 NOTE — Telephone Encounter (Signed)
Negative genetic testing on the STAT panel.  We will rerequisition for the larger panel that includes the melanoma genes.  We will call when this result is completed.

## 2016-06-15 ENCOUNTER — Other Ambulatory Visit: Payer: BC Managed Care – PPO

## 2016-06-24 ENCOUNTER — Ambulatory Visit
Admission: RE | Admit: 2016-06-24 | Discharge: 2016-06-24 | Disposition: A | Discharge status: Home / Self Care | Payer: BC Managed Care – PPO | Source: Ambulatory Visit | Attending: General Surgery | Admitting: General Surgery

## 2016-06-24 DIAGNOSIS — R928 Other abnormal and inconclusive findings on diagnostic imaging of breast: Secondary | ICD-10-CM

## 2016-06-24 MED ORDER — GADOBENATE DIMEGLUMINE 529 MG/ML IV SOLN
14.0000 mL | Freq: Once | INTRAVENOUS | Status: AC | PRN
  Administered 2016-06-24: 14 mL via INTRAVENOUS

## 2016-07-01 ENCOUNTER — Other Ambulatory Visit: Payer: Self-pay | Admitting: Hematology and Oncology

## 2016-07-04 ENCOUNTER — Ambulatory Visit
Admission: RE | Admit: 2016-07-04 | Discharge: 2016-07-04 | Disposition: A | Discharge status: Home / Self Care | Payer: BC Managed Care – PPO | Source: Ambulatory Visit | Attending: General Surgery | Admitting: General Surgery

## 2016-07-04 ENCOUNTER — Ambulatory Visit: Admission: RE | Admit: 2016-07-04 | Payer: BC Managed Care – PPO | Source: Ambulatory Visit

## 2016-07-04 ENCOUNTER — Other Ambulatory Visit: Payer: Self-pay | Admitting: General Surgery

## 2016-07-04 DIAGNOSIS — R928 Other abnormal and inconclusive findings on diagnostic imaging of breast: Secondary | ICD-10-CM

## 2016-07-04 HISTORY — PX: BREAST BIOPSY: SHX20

## 2016-07-04 MED ORDER — GADOBENATE DIMEGLUMINE 529 MG/ML IV SOLN
14.0000 mL | Freq: Once | INTRAVENOUS | Status: AC | PRN
  Administered 2016-07-04: 14 mL via INTRAVENOUS

## 2016-07-05 ENCOUNTER — Other Ambulatory Visit: Payer: Self-pay | Admitting: General Surgery

## 2016-07-05 DIAGNOSIS — Z170 Estrogen receptor positive status [ER+]: Principal | ICD-10-CM

## 2016-07-05 DIAGNOSIS — C50411 Malignant neoplasm of upper-outer quadrant of right female breast: Secondary | ICD-10-CM

## 2016-07-06 ENCOUNTER — Other Ambulatory Visit: Payer: Self-pay | Admitting: General Surgery

## 2016-07-06 DIAGNOSIS — C50411 Malignant neoplasm of upper-outer quadrant of right female breast: Secondary | ICD-10-CM

## 2016-07-06 DIAGNOSIS — Z170 Estrogen receptor positive status [ER+]: Principal | ICD-10-CM

## 2016-07-07 ENCOUNTER — Encounter (HOSPITAL_BASED_OUTPATIENT_CLINIC_OR_DEPARTMENT_OTHER): Payer: Self-pay | Admitting: *Deleted

## 2016-07-08 ENCOUNTER — Telehealth: Payer: Self-pay | Admitting: Hematology and Oncology

## 2016-07-08 NOTE — Telephone Encounter (Signed)
lvm to inform pt of 7/11 appt at 315 pm per sch msg

## 2016-07-10 HISTORY — PX: BREAST LUMPECTOMY: SHX2

## 2016-07-12 ENCOUNTER — Ambulatory Visit
Admission: RE | Admit: 2016-07-12 | Discharge: 2016-07-12 | Disposition: A | Discharge status: Home / Self Care | Payer: BC Managed Care – PPO | Source: Ambulatory Visit | Attending: General Surgery | Admitting: General Surgery

## 2016-07-12 DIAGNOSIS — C50411 Malignant neoplasm of upper-outer quadrant of right female breast: Secondary | ICD-10-CM

## 2016-07-12 DIAGNOSIS — Z170 Estrogen receptor positive status [ER+]: Principal | ICD-10-CM

## 2016-07-12 NOTE — Progress Notes (Signed)
Boost drink given with instructions to complete by 0400am dos, pt verbalized understanding.

## 2016-07-13 NOTE — Anesthesia Preprocedure Evaluation (Signed)
Anesthesia Evaluation  Patient identified by MRN, date of birth, ID band Patient awake    Reviewed: Allergy & Precautions, NPO status , Patient's Chart, lab work & pertinent test results  Airway Mallampati: II  TM Distance: >3 FB Neck ROM: Full    Dental no notable dental hx.    Pulmonary former smoker,    Pulmonary exam normal breath sounds clear to auscultation       Cardiovascular negative cardio ROS Normal cardiovascular exam Rhythm:Regular Rate:Normal     Neuro/Psych negative neurological ROS  negative psych ROS   GI/Hepatic negative GI ROS, Neg liver ROS,   Endo/Other  negative endocrine ROS  Renal/GU negative Renal ROS     Musculoskeletal negative musculoskeletal ROS (+)   Abdominal   Peds  Hematology negative hematology ROS (+)   Anesthesia Other Findings   Reproductive/Obstetrics                             Anesthesia Physical Anesthesia Plan  ASA: II  Anesthesia Plan: General and Regional   Post-op Pain Management:    Induction: Intravenous  PONV Risk Score and Plan: 3 and Ondansetron, Dexamethasone, Propofol and Midazolam  Airway Management Planned: LMA  Additional Equipment:   Intra-op Plan:   Post-operative Plan: Extubation in OR  Informed Consent: I have reviewed the patients History and Physical, chart, labs and discussed the procedure including the risks, benefits and alternatives for the proposed anesthesia with the patient or authorized representative who has indicated his/her understanding and acceptance.   Dental advisory given  Plan Discussed with: CRNA  Anesthesia Plan Comments:         Anesthesia Quick Evaluation

## 2016-07-14 ENCOUNTER — Encounter (HOSPITAL_BASED_OUTPATIENT_CLINIC_OR_DEPARTMENT_OTHER)
Admission: RE | Disposition: A | Discharge status: Home / Self Care | Payer: Self-pay | Source: Ambulatory Visit | Attending: General Surgery

## 2016-07-14 ENCOUNTER — Ambulatory Visit (HOSPITAL_BASED_OUTPATIENT_CLINIC_OR_DEPARTMENT_OTHER): Payer: BC Managed Care – PPO | Admitting: Anesthesiology

## 2016-07-14 ENCOUNTER — Ambulatory Visit (HOSPITAL_BASED_OUTPATIENT_CLINIC_OR_DEPARTMENT_OTHER)
Admission: RE | Admit: 2016-07-14 | Discharge: 2016-07-14 | Disposition: A | Discharge status: Home / Self Care | Payer: BC Managed Care – PPO | Source: Ambulatory Visit | Attending: General Surgery | Admitting: General Surgery

## 2016-07-14 ENCOUNTER — Ambulatory Visit
Admission: RE | Admit: 2016-07-14 | Discharge: 2016-07-14 | Disposition: A | Discharge status: Home / Self Care | Payer: BC Managed Care – PPO | Source: Ambulatory Visit | Attending: General Surgery | Admitting: General Surgery

## 2016-07-14 ENCOUNTER — Encounter (HOSPITAL_COMMUNITY)
Admission: RE | Admit: 2016-07-14 | Discharge: 2016-07-14 | Disposition: A | Discharge status: Home / Self Care | Payer: BC Managed Care – PPO | Source: Ambulatory Visit | Attending: General Surgery | Admitting: General Surgery

## 2016-07-14 ENCOUNTER — Encounter (HOSPITAL_BASED_OUTPATIENT_CLINIC_OR_DEPARTMENT_OTHER): Payer: Self-pay

## 2016-07-14 DIAGNOSIS — C50311 Malignant neoplasm of lower-inner quadrant of right female breast: Secondary | ICD-10-CM | POA: Diagnosis present

## 2016-07-14 DIAGNOSIS — G4730 Sleep apnea, unspecified: Secondary | ICD-10-CM | POA: Diagnosis not present

## 2016-07-14 DIAGNOSIS — C50411 Malignant neoplasm of upper-outer quadrant of right female breast: Secondary | ICD-10-CM

## 2016-07-14 DIAGNOSIS — Z803 Family history of malignant neoplasm of breast: Secondary | ICD-10-CM | POA: Insufficient documentation

## 2016-07-14 DIAGNOSIS — Z87891 Personal history of nicotine dependence: Secondary | ICD-10-CM | POA: Diagnosis not present

## 2016-07-14 DIAGNOSIS — Z170 Estrogen receptor positive status [ER+]: Principal | ICD-10-CM

## 2016-07-14 HISTORY — PX: BREAST LUMPECTOMY WITH RADIOACTIVE SEED AND SENTINEL LYMPH NODE BIOPSY: SHX6550

## 2016-07-14 SURGERY — BREAST LUMPECTOMY WITH RADIOACTIVE SEED AND SENTINEL LYMPH NODE BIOPSY
Anesthesia: Regional | Site: Breast | Laterality: Right

## 2016-07-14 MED ORDER — BUPIVACAINE HCL (PF) 0.25 % IJ SOLN
INTRAMUSCULAR | Status: AC
  Filled 2016-07-14: qty 30

## 2016-07-14 MED ORDER — PROPOFOL 500 MG/50ML IV EMUL
INTRAVENOUS | Status: AC
  Filled 2016-07-14: qty 50

## 2016-07-14 MED ORDER — FENTANYL CITRATE (PF) 100 MCG/2ML IJ SOLN
50.0000 ug | INTRAMUSCULAR | Status: DC | PRN
  Administered 2016-07-14: 50 ug via INTRAVENOUS

## 2016-07-14 MED ORDER — DEXAMETHASONE SODIUM PHOSPHATE 10 MG/ML IJ SOLN
INTRAMUSCULAR | Status: AC
  Filled 2016-07-14: qty 1

## 2016-07-14 MED ORDER — GABAPENTIN 300 MG PO CAPS
300.0000 mg | ORAL_CAPSULE | ORAL | Status: AC
  Administered 2016-07-14: 300 mg via ORAL

## 2016-07-14 MED ORDER — LACTATED RINGERS IV SOLN
INTRAVENOUS | Status: DC
  Administered 2016-07-14 (×2): via INTRAVENOUS

## 2016-07-14 MED ORDER — MIDAZOLAM HCL 2 MG/2ML IJ SOLN
1.0000 mg | INTRAMUSCULAR | Status: DC | PRN
  Administered 2016-07-14: 2 mg via INTRAVENOUS

## 2016-07-14 MED ORDER — FENTANYL CITRATE (PF) 100 MCG/2ML IJ SOLN
INTRAMUSCULAR | Status: AC
  Filled 2016-07-14: qty 2

## 2016-07-14 MED ORDER — MIDAZOLAM HCL 2 MG/2ML IJ SOLN
INTRAMUSCULAR | Status: AC
Start: 2016-07-14 — End: ?
  Filled 2016-07-14: qty 2

## 2016-07-14 MED ORDER — PHENYLEPHRINE HCL 10 MG/ML IJ SOLN
INTRAMUSCULAR | Status: DC | PRN
  Administered 2016-07-14: 80 ug via INTRAVENOUS

## 2016-07-14 MED ORDER — OXYCODONE-ACETAMINOPHEN 10-325 MG PO TABS: 1.0000 | ORAL_TABLET | Freq: Four times a day (QID) | ORAL | 0 refills | Status: DC | PRN

## 2016-07-14 MED ORDER — BUPIVACAINE HCL (PF) 0.25 % IJ SOLN
INTRAMUSCULAR | Status: DC | PRN
Start: 2016-07-14 — End: 2016-07-14
  Administered 2016-07-14: 11 mL

## 2016-07-14 MED ORDER — BUPIVACAINE-EPINEPHRINE (PF) 0.5% -1:200000 IJ SOLN
INTRAMUSCULAR | Status: DC | PRN
  Administered 2016-07-14: 30 mL

## 2016-07-14 MED ORDER — SCOPOLAMINE 1 MG/3DAYS TD PT72: 1.0000 | MEDICATED_PATCH | Freq: Once | TRANSDERMAL | Status: DC | PRN

## 2016-07-14 MED ORDER — CEFAZOLIN SODIUM-DEXTROSE 2-4 GM/100ML-% IV SOLN
2.0000 g | INTRAVENOUS | Status: AC
  Administered 2016-07-14: 2 g via INTRAVENOUS

## 2016-07-14 MED ORDER — SODIUM CHLORIDE 0.9 % IJ SOLN
INTRAMUSCULAR | Status: AC
  Filled 2016-07-14: qty 10

## 2016-07-14 MED ORDER — DEXAMETHASONE SODIUM PHOSPHATE 4 MG/ML IJ SOLN
INTRAMUSCULAR | Status: DC | PRN
  Administered 2016-07-14: 10 mg via INTRAVENOUS

## 2016-07-14 MED ORDER — CEFAZOLIN SODIUM-DEXTROSE 2-4 GM/100ML-% IV SOLN
INTRAVENOUS | Status: AC
  Filled 2016-07-14: qty 100

## 2016-07-14 MED ORDER — LIDOCAINE 2% (20 MG/ML) 5 ML SYRINGE
INTRAMUSCULAR | Status: DC | PRN
  Administered 2016-07-14: 100 mg via INTRAVENOUS

## 2016-07-14 MED ORDER — TECHNETIUM TC 99M SULFUR COLLOID FILTERED: 1.0000 | Freq: Once | INTRAVENOUS | Status: DC | PRN

## 2016-07-14 MED ORDER — ONDANSETRON HCL 4 MG/2ML IJ SOLN
INTRAMUSCULAR | Status: AC
  Filled 2016-07-14: qty 2

## 2016-07-14 MED ORDER — GABAPENTIN 300 MG PO CAPS
ORAL_CAPSULE | ORAL | Status: AC
  Filled 2016-07-14: qty 1

## 2016-07-14 MED ORDER — CELECOXIB 200 MG PO CAPS
ORAL_CAPSULE | ORAL | Status: AC
  Filled 2016-07-14: qty 2

## 2016-07-14 MED ORDER — PROPOFOL 10 MG/ML IV BOLUS
INTRAVENOUS | Status: DC | PRN
  Administered 2016-07-14: 140 mg via INTRAVENOUS

## 2016-07-14 MED ORDER — ACETAMINOPHEN 500 MG PO TABS
1000.0000 mg | ORAL_TABLET | ORAL | Status: AC
  Administered 2016-07-14: 1000 mg via ORAL

## 2016-07-14 MED ORDER — MIDAZOLAM HCL 2 MG/2ML IJ SOLN
INTRAMUSCULAR | Status: AC
  Filled 2016-07-14: qty 2

## 2016-07-14 MED ORDER — ONDANSETRON HCL 4 MG/2ML IJ SOLN
INTRAMUSCULAR | Status: DC | PRN
  Administered 2016-07-14: 4 mg via INTRAVENOUS

## 2016-07-14 MED ORDER — LIDOCAINE HCL (CARDIAC) 20 MG/ML IV SOLN
INTRAVENOUS | Status: AC
  Filled 2016-07-14: qty 5

## 2016-07-14 MED ORDER — METHYLENE BLUE 0.5 % INJ SOLN
INTRAVENOUS | Status: AC
  Filled 2016-07-14: qty 10

## 2016-07-14 MED ORDER — ACETAMINOPHEN 500 MG PO TABS
ORAL_TABLET | ORAL | Status: AC
  Filled 2016-07-14: qty 2

## 2016-07-14 MED ORDER — CELECOXIB 200 MG PO CAPS
200.0000 mg | ORAL_CAPSULE | ORAL | Status: AC
  Administered 2016-07-14: 200 mg via ORAL

## 2016-07-14 SURGICAL SUPPLY — 63 items
ADH SKN CLS APL DERMABOND .7 (GAUZE/BANDAGES/DRESSINGS) ×1
APPLIER CLIP 9.375 MED OPEN (MISCELLANEOUS)
APR CLP MED 9.3 20 MLT OPN (MISCELLANEOUS)
BINDER BREAST LRG (GAUZE/BANDAGES/DRESSINGS) ×2 IMPLANT
BINDER BREAST MEDIUM (GAUZE/BANDAGES/DRESSINGS) IMPLANT
BINDER BREAST XLRG (GAUZE/BANDAGES/DRESSINGS) IMPLANT
BINDER BREAST XXLRG (GAUZE/BANDAGES/DRESSINGS) IMPLANT
BLADE SURG 15 STRL LF DISP TIS (BLADE) ×1 IMPLANT
BLADE SURG 15 STRL SS (BLADE) ×3
CANISTER SUC SOCK COL 7IN (MISCELLANEOUS) IMPLANT
CANISTER SUCT 1200ML W/VALVE (MISCELLANEOUS) IMPLANT
CHLORAPREP W/TINT 26ML (MISCELLANEOUS) ×3 IMPLANT
CLIP APPLIE 9.375 MED OPEN (MISCELLANEOUS) IMPLANT
CLOSURE WOUND 1/2 X4 (GAUZE/BANDAGES/DRESSINGS) ×1
COVER BACK TABLE 60X90IN (DRAPES) ×3 IMPLANT
COVER MAYO STAND STRL (DRAPES) ×3 IMPLANT
COVER PROBE W GEL 5X96 (DRAPES) ×3 IMPLANT
DECANTER SPIKE VIAL GLASS SM (MISCELLANEOUS) IMPLANT
DERMABOND ADVANCED (GAUZE/BANDAGES/DRESSINGS) ×2
DERMABOND ADVANCED .7 DNX12 (GAUZE/BANDAGES/DRESSINGS) ×1 IMPLANT
DEVICE DUBIN W/COMP PLATE 8390 (MISCELLANEOUS) ×3 IMPLANT
DRAPE LAPAROSCOPIC ABDOMINAL (DRAPES) ×3 IMPLANT
DRAPE UTILITY XL STRL (DRAPES) ×3 IMPLANT
ELECT COATED BLADE 2.86 ST (ELECTRODE) ×3 IMPLANT
ELECT REM PT RETURN 9FT ADLT (ELECTROSURGICAL) ×3
ELECTRODE REM PT RTRN 9FT ADLT (ELECTROSURGICAL) ×1 IMPLANT
GLOVE BIO SURGEON STRL SZ7 (GLOVE) ×6 IMPLANT
GLOVE BIOGEL PI IND STRL 7.0 (GLOVE) IMPLANT
GLOVE BIOGEL PI IND STRL 7.5 (GLOVE) ×1 IMPLANT
GLOVE BIOGEL PI INDICATOR 7.0 (GLOVE) ×4
GLOVE BIOGEL PI INDICATOR 7.5 (GLOVE) ×2
GLOVE SURG SS PI 6.5 STRL IVOR (GLOVE) ×2 IMPLANT
GOWN STRL REUS W/ TWL LRG LVL3 (GOWN DISPOSABLE) ×2 IMPLANT
GOWN STRL REUS W/TWL LRG LVL3 (GOWN DISPOSABLE) ×6
HEMOSTAT ARISTA ABSORB 3G PWDR (MISCELLANEOUS) IMPLANT
ILLUMINATOR WAVEGUIDE N/F (MISCELLANEOUS) ×2 IMPLANT
KIT MARKER MARGIN INK (KITS) ×3 IMPLANT
LIGHT WAVEGUIDE WIDE FLAT (MISCELLANEOUS) IMPLANT
NDL HYPO 25X1 1.5 SAFETY (NEEDLE) ×1 IMPLANT
NDL SAFETY ECLIPSE 18X1.5 (NEEDLE) IMPLANT
NEEDLE HYPO 18GX1.5 SHARP (NEEDLE)
NEEDLE HYPO 25X1 1.5 SAFETY (NEEDLE) ×3 IMPLANT
NS IRRIG 1000ML POUR BTL (IV SOLUTION) IMPLANT
PACK BASIN DAY SURGERY FS (CUSTOM PROCEDURE TRAY) ×3 IMPLANT
PENCIL BUTTON HOLSTER BLD 10FT (ELECTRODE) ×3 IMPLANT
SLEEVE SCD COMPRESS KNEE MED (MISCELLANEOUS) ×3 IMPLANT
SPONGE LAP 4X18 X RAY DECT (DISPOSABLE) ×3 IMPLANT
STRIP CLOSURE SKIN 1/2X4 (GAUZE/BANDAGES/DRESSINGS) ×2 IMPLANT
SUT ETHILON 2 0 FS 18 (SUTURE) IMPLANT
SUT MNCRL AB 4-0 PS2 18 (SUTURE) ×3 IMPLANT
SUT MON AB 5-0 PS2 18 (SUTURE) ×2 IMPLANT
SUT SILK 2 0 SH (SUTURE) ×2 IMPLANT
SUT VIC AB 2-0 SH 27 (SUTURE) ×6
SUT VIC AB 2-0 SH 27XBRD (SUTURE) ×1 IMPLANT
SUT VIC AB 3-0 SH 27 (SUTURE) ×6
SUT VIC AB 3-0 SH 27X BRD (SUTURE) ×1 IMPLANT
SUT VIC AB 5-0 PS2 18 (SUTURE) ×2 IMPLANT
SYR CONTROL 10ML LL (SYRINGE) ×3 IMPLANT
TOWEL OR 17X24 6PK STRL BLUE (TOWEL DISPOSABLE) ×3 IMPLANT
TOWEL OR NON WOVEN STRL DISP B (DISPOSABLE) ×3 IMPLANT
TUBE CONNECTING 20'X1/4 (TUBING)
TUBE CONNECTING 20X1/4 (TUBING) IMPLANT
YANKAUER SUCT BULB TIP NO VENT (SUCTIONS) IMPLANT

## 2016-07-14 NOTE — Addendum Note (Signed)
Addendum  created 07/14/16 1251 by Nolon Nations, MD   Anesthesia Intra Blocks edited, Anesthesia Intra Meds edited, Child order released for a procedure order, Sign clinical note

## 2016-07-14 NOTE — Anesthesia Procedure Notes (Signed)
Anesthesia Regional Block: Pectoralis block   Pre-Anesthetic Checklist: ,, timeout performed, Correct Patient, Correct Site, Correct Laterality, Correct Procedure, Correct Position, site marked, Risks and benefits discussed,  Surgical consent,  Pre-op evaluation,  At surgeon's request and post-op pain management  Laterality: Right  Prep: chloraprep       Needles:   Needle Type: Stimiplex     Needle Length: 9cm      Additional Needles:   Procedures: ultrasound guided,,,,,,,,  Narrative:  Start time: 07/14/2016 7:13 AM End time: 07/14/2016 7:18 AM Injection made incrementally with aspirations every 5 mL.  Performed by: Personally  Anesthesiologist: Nolon Nations  Additional Notes: Patient tolerated well. Good fascial spread noted.

## 2016-07-14 NOTE — H&P (Signed)
62 yof referred by Dr Unk Lightning for newly diagnosed right breast cancer. She has no prior history. started mm at age 41. family history in [place]. she had no mass or dc. this was screening mm. breast density is d. she had a right breast distortion noted and a left breast mass. she does have prior history in 2009 of negative core on the left side. [place] on left breast shows dense fibroglandular tissue and this was recommended for six month follow up. the [place] of right axilla is negative. US of the right breast mass shows a 1x1.1x1.1 cm mass at 630. core biopsy was done and shows an idc that is er pos at 66, pr pos at 38, her 2 negative and Ki is 10%. she is here with her husband to discuss options. They live near [place]. 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    Past Surgical History Breast Biopsy  Bilateral. Oral Surgery   Diagnostic Studies History  Colonoscopy  never Mammogram  1-3 years ago Pap Smear  1-5 years ago  Medication History  Medications Reconciled  Social History  Alcohol use  Occasional alcohol use. Caffeine use  Carbonated beverages. No drug use  Tobacco use  Former smoker.  Family History  Breast Cancer  Family Members In General. Diabetes Mellitus  Family Members In General. Heart Disease  Family Members In General.  Pregnancy / Birth History  Age at menarche  49 years. Contraceptive History  Contraceptive implant, Oral contraceptives. Gravida  1 Length (months) of breastfeeding  3-6 Maternal age  26-25 Para  1 Regular periods   Other Problems  Lump In Breast  Sleep Apnea   Review of Systems  General Present- Fatigue and Weight Gain. Not Present- Appetite Loss, Chills, Fever, Night Sweats and Weight Loss. Skin Present- Rash. Not Present- Change in Wart/Mole, Dryness, Hives, Jaundice, New Lesions, Non-Healing Wounds and Ulcer. HEENT Present- Wears glasses/contact lenses. Not Present- Earache, Hearing  Loss, Hoarseness, Nose Bleed, Oral Ulcers, Ringing in the Ears, Seasonal Allergies, Sinus Pain, Sore Throat, Visual Disturbances and Yellow Eyes. Respiratory Present- Snoring. Not Present- Bloody sputum, Chronic Cough, Difficulty Breathing and Wheezing. Breast Present- Breast Mass. Not Present- Breast Pain, Nipple Discharge and Skin Changes. Cardiovascular Not Present- Chest Pain, Difficulty Breathing Lying Down, Leg Cramps, Palpitations, Rapid Heart Rate, Shortness of Breath and Swelling of Extremities. Gastrointestinal Not Present- Abdominal Pain, Bloating, Bloody Stool, Change in Bowel Habits, Chronic diarrhea, Constipation, Difficulty Swallowing, Excessive gas, Gets full quickly at meals, Hemorrhoids, Indigestion, Nausea, Rectal Pain and Vomiting. Female Genitourinary Not Present- Frequency, Nocturia, Painful Urination, Pelvic Pain and Urgency. Musculoskeletal Present- Joint Pain. Not Present- Back Pain, Joint Stiffness, Muscle Pain, Muscle Weakness and Swelling of Extremities. Neurological Present- Headaches. Not Present- Decreased Memory, Fainting, Numbness, Seizures, Tingling, Tremor, Trouble walking and Weakness. Psychiatric Not Present- Anxiety, Bipolar, Change in Sleep Pattern, Depression, Fearful and Frequent crying. Endocrine Not Present- Cold Intolerance, Excessive Hunger, Hair Changes, Heat Intolerance, Hot flashes and New Diabetes. Hematology Not Present- Blood Thinners, Easy Bruising, Excessive bleeding, Gland problems, HIV and Persistent Infections.   Physical Exam General Mental Status-Alert. Orientation-Oriented X3. Head and Neck Thyroid -Note: no thyroid mass, no thyromegaly. Eye Sclera/Conjunctiva - Bilateral-No scleral icterus. Chest and Lung Exam Chest and lung exam reveals -quiet, even and easy respiratory effort with no use of accessory muscles and on auscultation, normal breath sounds, no adventitious sounds and normal vocal resonance. Breast Nipples-No  Discharge. Breast Lump-No Palpable Breast Mass. Cardiovascular Cardiovascular  examination reveals -normal heart sounds, regular rate and rhythm with no murmurs. Abdomen Note: soft nt Lymphatic Head & Neck General Head & Neck Lymphatics: Bilateral - Description - Normal. Axillary General Axillary Region: Bilateral - Description - Normal. Note: no [place] adenopathy  Assessment & Plan BREAST CANCER OF LOWER-INNER QUADRANT OF RIGHT FEMALE BREAST (C50.311) Right lumpectomy/sn biopsy We discussed the staging and pathophysiology of breast cancer. We discussed all of the different options for treatment for breast cancer including surgery, chemotherapy, radiation therapy, Herceptin, and antiestrogen therapy. We discussed a sentinel lymph node biopsy as she does not appear to having lymph node involvement right now. We discussed the performance of that with injection of radioactive tracer. We discussed that she would have an incision underneath her axillary hairline or would be done through the same incision. We discussed that there is a chance of having a positive node with a sentinel lymph node biopsy. We discussed up to a 5% risk lifetime of chronic shoulder pain as well as lymphedema associated with a sentinel lymph node biopsy. We discussed the options for treatment of the breast cancer which included lumpectomy versus a mastectomy. We discussed the performance of the lumpectomy with radioactive seed placement. We discussed a 5-10% chance of a positive margin requiring reexcision in the operating room. We also discussed that she will need radiation therapy if she undergoes lumpectomy. The breast cannot undergo more radiation therapy in the same breast after lumpectomy in the future. We discussed the mastectomy (removal of whole breast) and the postoperative care for that as well. Mastectomy can be followed by reconstruction. This is a more extensive surgery and requires more recovery. The decision for  lumpectomy vs mastectomy has no impact on decision for chemotherapy. Most mastectomy patients will not need radiation therapy. We discussed that there is no difference in her survival whether she undergoes lumpectomy with radiation therapy or antiestrogen therapy versus a mastectomy. There is also no real difference between her recurrence in the breast. We discussed the risks of operation and recovery.

## 2016-07-14 NOTE — Transfer of Care (Signed)
Immediate Anesthesia Transfer of Care Note  Patient: Dorothy Crosby  Procedure(s) Performed: Procedure(s): BREAST LUMPECTOMY WITH RADIOACTIVE SEED AND SENTINEL LYMPH NODE BIOPSY (Right)  Patient Location: PACU  Anesthesia Type:General  Level of Consciousness: sedated  Airway & Oxygen Therapy: Patient Spontanous Breathing and Patient connected to face mask oxygen  Post-op Assessment: Report given to RN and Post -op Vital signs reviewed and stable  Post vital signs: Reviewed and stable  Last Vitals:  Vitals:   07/14/16 0725 07/14/16 0730  BP:  (!) 97/57  Pulse: 78 94  Resp: 14 16  Temp:      Last Pain:  Vitals:   07/14/16 0631  TempSrc: Oral         Complications: No apparent anesthesia complications

## 2016-07-14 NOTE — Op Note (Signed)
Preoperative diagnosis: stage I right breast cancer Postoperative diagnosis: same as above Procedure: Right breast seed guided lumpectomy, right deep axillary sentinel node biopsy Surgeon: Dr Serita Grammes EBL: minimal Anes: general  Specimens  1. Right breast tissue marked with paint 2. Right axillary sentinel nodes with highest count 239 3. Additional right breast inferior margin short superior.long lateral double deep Complications none Drains none Sponge count correct Dispo to pacu stable  Indications: This is a 5 yof with stage I right breast cancer. We discussed right breast seed guided lumpectomy, right axillary sn biopsy after being seen in [place]. She had seed placed prior to beginning and I had these mm in the OR. She has undergone two other mr biopsies that are benign and genetics are negative.   Procedure: After informed consent was obtained the patient was taken to the operating room. She was given antibiotics. Sequential compression devices were on her legs. She was then placed under general anesthesia with an LMA. Then she was prepped and draped in the standard sterile surgical fashion. Surgical timeout was then performed.  Ithen located the seed in the inferior right braest.  I infiltrated marcaine in the area of incision.I made a periareolar incision to hide the scar.I then removed the seedwith an attempt to get a clear margin. This waspassed off the table after marking with paint.I did confirm removal of theclipand seedwith mammography. I thought inferior margin was closed and removed some additional tissue. I obtained hemostasis. I placed clips in this cavity. The breast tissue was approximated with 2-0 vicryl. The skin was closed with 3-0 vicryl and 5-0 monocryl. Glue and steristrips were placed.   I then made an axillary incision. I opened the axillary fascia. I identified the sentinel nodes with the highest count as above.There was no gross nodal  disease.There was no background radioactivity. I obtained hemostasis. I closed the axillary fascia and breast tissue with 2-0 vicryl. The skin was closed with 3-0 vicryl and 4-0 monocryl.Glue and steristrips were applied.A breast binder was placed. She was extubated and transferred to recovery stable

## 2016-07-14 NOTE — Progress Notes (Signed)
Assisted Dr. Germeroth with right, ultrasound guided, pectoralis block. Side rails up, monitors on throughout procedure. See vital signs in flow sheet. Tolerated Procedure well. 

## 2016-07-14 NOTE — Anesthesia Postprocedure Evaluation (Signed)
Anesthesia Post Note  Patient: Dorothy Crosby  Procedure(s) Performed: Procedure(s) (LRB): BREAST LUMPECTOMY WITH RADIOACTIVE SEED AND SENTINEL LYMPH NODE BIOPSY (Right)     Patient location during evaluation: PACU Anesthesia Type: Regional and General Level of consciousness: sedated and patient cooperative Pain management: pain level controlled Vital Signs Assessment: post-procedure vital signs reviewed and stable Respiratory status: spontaneous breathing Cardiovascular status: stable Anesthetic complications: no    Last Vitals:  Vitals:   07/14/16 0900 07/14/16 0924  BP: (!) 85/52 100/70  Pulse: 74 79  Resp: 13 14  Temp:  36.6 C    Last Pain:  Vitals:   07/14/16 0924  TempSrc: Oral  PainSc: 0-No pain                 Nolon Nations

## 2016-07-14 NOTE — Anesthesia Procedure Notes (Signed)
Procedure Name: LMA Insertion Date/Time: 07/14/2016 7:37 AM Performed by: Maryella Shivers Pre-anesthesia Checklist: Patient identified, Emergency Drugs available, Suction available and Patient being monitored Patient Re-evaluated:Patient Re-evaluated prior to inductionOxygen Delivery Method: Circle system utilized Preoxygenation: Pre-oxygenation with 100% oxygen Intubation Type: IV induction Ventilation: Mask ventilation without difficulty LMA: LMA inserted LMA Size: 4.0 Number of attempts: 1 Airway Equipment and Method: Bite block Placement Confirmation: positive ETCO2 Tube secured with: Tape Dental Injury: Teeth and Oropharynx as per pre-operative assessment

## 2016-07-14 NOTE — Discharge Instructions (Signed)
[facility] °Office Phone Number [phone] ° °POST OP INSTRUCTIONS ° °Always review your discharge instruction sheet given to you by the facility where your surgery was performed. ° °IF YOU HAVE DISABILITY OR FAMILY LEAVE FORMS, YOU MUST BRING THEM TO THE OFFICE FOR PROCESSING.  DO NOT GIVE THEM TO YOUR DOCTOR. ° °1. A prescription for pain medication may be given to you upon discharge.  Take your pain medication as prescribed, if needed.  If narcotic pain medicine is not needed, then you may take acetaminophen (Tylenol), naprosyn (Alleve) or ibuprofen (Advil) as needed. °2. Take your usually prescribed medications unless otherwise directed °3. If you need a refill on your pain medication, please contact your pharmacy.  They will contact our office to request authorization.  Prescriptions will not be filled after 5pm or on week-ends. °4. You should eat very light the first 24 hours after surgery, such as soup, crackers, pudding, etc.  Resume your normal diet the day after surgery. °5. Most patients will experience some swelling and bruising in the breast.  Ice packs and a good support bra will help.  Wear the breast binder provided or a sports bra for 72 hours day and night.  After that wear a sports bra during the day until you return to the office. Swelling and bruising can take several days to resolve.  °6. It is common to experience some constipation if taking pain medication after surgery.  Increasing fluid intake and taking a stool softener will usually help or prevent this problem from occurring.  A mild laxative (Milk of Magnesia or Miralax) should be taken according to package directions if there are no bowel movements after 48 hours. °7. Unless discharge instructions indicate otherwise, you may remove your bandages 48 hours after surgery and you may shower at that time.  You may have steri-strips (small skin tapes) in place directly over the incision.  These strips should be left on the  skin for 7-10 days and will come off on their own.  If your surgeon used skin glue on the incision, you may shower in 24 hours.  The glue will flake off over the next 2-3 weeks.  Any sutures or staples will be removed at the office during your follow-up visit. °8. ACTIVITIES:  You may resume regular daily activities (gradually increasing) beginning the next day.  Wearing a good support bra or sports bra minimizes pain and swelling.  You may have sexual intercourse when it is comfortable. °a. You may drive when you no longer are taking prescription pain medication, you can comfortably wear a seatbelt, and you can safely maneuver your car and apply brakes. °b. RETURN TO WORK:  ______________________________________________________________________________________ °9. You should see your doctor in the office for a follow-up appointment approximately two weeks after your surgery.  Your doctor’s nurse will typically make your follow-up appointment when she calls you with your pathology report.  Expect your pathology report 3-4 business days after your surgery.  You may call to check if you do not hear from us after three days. °10. OTHER INSTRUCTIONS: _______________________________________________________________________________________________ _____________________________________________________________________________________________________________________________________ °_____________________________________________________________________________________________________________________________________ °_____________________________________________________________________________________________________________________________________ ° °WHEN TO CALL DR WAKEFIELD: °1. Fever over 101.0 °2. Nausea and/or vomiting. °3. Extreme swelling or bruising. °4. Continued bleeding from incision. °5. Increased pain, redness, or drainage from the incision. ° °The clinic staff is available to answer your questions during regular  business hours.  Please don’t hesitate to call and ask to speak to one of the nurses for clinical concerns.  If   you have a medical emergency, go to the nearest emergency room or call 911.  A surgeon from [facility] is always on call at the hospital. ° °For further questions, please visit [email or web address] ° ° ° ° ° °Post Anesthesia Home Care Instructions ° °Activity: °Get plenty of rest for the remainder of the day. A responsible individual must stay with you for 24 hours following the procedure.  °For the next 24 hours, DO NOT: °-Drive a car °-Operate machinery °-Drink alcoholic beverages °-Take any medication unless instructed by your physician °-Make any legal decisions or sign important papers. ° °Meals: °Start with liquid foods such as gelatin or soup. Progress to regular foods as tolerated. Avoid greasy, spicy, heavy foods. If nausea and/or vomiting occur, drink only clear liquids until the nausea and/or vomiting subsides. Call your physician if vomiting continues. ° °Special Instructions/Symptoms: °Your throat may feel dry or sore from the anesthesia or the breathing tube placed in your throat during surgery. If this causes discomfort, gargle with warm salt water. The discomfort should disappear within 24 hours. ° °If you had a scopolamine patch placed behind your ear for the management of post- operative nausea and/or vomiting: ° °1. The medication in the patch is effective for 72 hours, after which it should be removed.  Wrap patch in a tissue and discard in the trash. Wash hands thoroughly with soap and water. °2. You may remove the patch earlier than 72 hours if you experience unpleasant side effects which may include dry mouth, dizziness or visual disturbances. °3. Avoid touching the patch. Wash your hands with soap and water after contact with the patch. °  ° °

## 2016-07-14 NOTE — Interval H&P Note (Signed)
History and Physical Interval Note:  07/14/2016 7:13 AM  Dorothy Crosby  has presented today for surgery, with the diagnosis of RIGHT BREAST CANCER  The various methods of treatment have been discussed with the patient and family. After consideration of risks, benefits and other options for treatment, the patient has consented to  Procedure(s): BREAST LUMPECTOMY WITH RADIOACTIVE SEED AND SENTINEL LYMPH NODE BIOPSY (Right) as a surgical intervention .  The patient's history has been reviewed, patient examined, no change in status, stable for surgery.  I have reviewed the patient's chart and labs.  Questions were answered to the patient's satisfaction.     Bradely Rudin

## 2016-07-14 NOTE — Progress Notes (Signed)
Patient ID: Dorothy Crosby [facility], female   DOB: 05-20-1975, 41 y.o.   MRN: [id number] [place] reviewed day of surgery

## 2016-07-15 ENCOUNTER — Encounter (HOSPITAL_BASED_OUTPATIENT_CLINIC_OR_DEPARTMENT_OTHER): Payer: Self-pay | Admitting: General Surgery

## 2016-07-20 ENCOUNTER — Ambulatory Visit: Payer: BC Managed Care – PPO | Admitting: Hematology and Oncology

## 2016-07-21 ENCOUNTER — Telehealth: Payer: Self-pay | Admitting: *Deleted

## 2016-07-21 NOTE — Telephone Encounter (Signed)
Ordered oncotype per Dr. Gudena.  Faxed requisition to pathology and confirmed receipt.  Faxed PA to [employer].  

## 2016-07-29 ENCOUNTER — Encounter (HOSPITAL_COMMUNITY): Payer: Self-pay

## 2016-07-29 ENCOUNTER — Telehealth: Payer: Self-pay | Admitting: *Deleted

## 2016-07-29 NOTE — Telephone Encounter (Signed)
Received oncotype score of 13/8%. Physician team notified. Left vm for pt to return call to discuss results and next steps. Contact information provided.

## 2016-07-29 NOTE — Telephone Encounter (Signed)
Pt return call, discussed oncotype score and that she does not need chemotherapy. Pt request to have radiation in [place] d/t travel to [employer]. Referral request sent. Denies further needs at this time.

## 2016-08-11 ENCOUNTER — Other Ambulatory Visit: Payer: Self-pay | Admitting: Hematology and Oncology

## 2016-08-16 ENCOUNTER — Telehealth: Payer: Self-pay | Admitting: *Deleted

## 2016-08-16 NOTE — Telephone Encounter (Signed)
Referral sent to [facility] for pt to see Dr. Orlene Erm for xrt.

## 2016-09-09 ENCOUNTER — Other Ambulatory Visit: Payer: Self-pay | Admitting: Hematology and Oncology

## 2016-10-05 ENCOUNTER — Telehealth: Payer: Self-pay | Admitting: *Deleted

## 2016-10-05 NOTE — Telephone Encounter (Signed)
Left message for a return phone call to found out when her last XRT date is in [place] so we can get her appointment back here when she is finished.

## 2016-10-10 ENCOUNTER — Telehealth: Payer: Self-pay | Admitting: *Deleted

## 2016-10-10 NOTE — Telephone Encounter (Signed)
Spoke with patient to give her an appointment with Dr. Lindi Adie post XRT follow up. Appointment confirmed for 10/18/16 at 3pm.

## 2016-10-10 NOTE — Telephone Encounter (Signed)
Let vm for pt to return call regarding final xrt tx in [place] in order to schedule appt with Dr. Lindi Adie. Contact information provided.

## 2016-10-14 ENCOUNTER — Other Ambulatory Visit: Payer: Self-pay | Admitting: Hematology and Oncology

## 2016-10-18 ENCOUNTER — Ambulatory Visit (HOSPITAL_BASED_OUTPATIENT_CLINIC_OR_DEPARTMENT_OTHER): Payer: BC Managed Care – PPO | Admitting: Hematology and Oncology

## 2016-10-18 ENCOUNTER — Telehealth: Payer: Self-pay | Admitting: Hematology and Oncology

## 2016-10-18 ENCOUNTER — Encounter: Payer: Self-pay | Admitting: *Deleted

## 2016-10-18 DIAGNOSIS — Z170 Estrogen receptor positive status [ER+]: Secondary | ICD-10-CM | POA: Diagnosis not present

## 2016-10-18 DIAGNOSIS — Z923 Personal history of irradiation: Secondary | ICD-10-CM

## 2016-10-18 DIAGNOSIS — Z79811 Long term (current) use of aromatase inhibitors: Secondary | ICD-10-CM | POA: Diagnosis not present

## 2016-10-18 DIAGNOSIS — C50511 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 female breast: Secondary | ICD-10-CM

## 2016-10-18 MED ORDER — TAMOXIFEN CITRATE 20 MG PO TABS: 20.0000 mg | ORAL_TABLET | Freq: Every day | ORAL | 3 refills | Status: DC

## 2016-10-18 NOTE — Telephone Encounter (Signed)
Gave patient avs and calendar per 10/9 los

## 2016-10-18 NOTE — Assessment & Plan Note (Signed)
07/14/2016: Right lumpectomy: 1.7 cm IDC grade 2 with calcifications, margins negative,PASH, additional inferior margin excision microscopic focus of residual cancer 0.4 cm, 0/4 lymph nodes negative, ER 70%, PR 90%, HER-2 negative, Ki-67 10%, T1 CN 0 stage IA Oncotype DX score 13:8% risk of distant recurrence with tamoxifen alone  Adjuvant radiation therapy [place] 08/19/2016 completed 10/11/2016  Treatment plan: Adjuvant antiestrogen therapy with tamoxifen 20 mg daily 10 years Patient was previously taking tamoxifen and had tolerated it very well.  Return to clinic in 6 months for follow-up

## 2016-10-18 NOTE — Progress Notes (Signed)
Patient Care Team: Myrlene Broker, MD as PCP - General (Family Medicine) Rolm Bookbinder, MD as Consulting Physician (General Surgery) Nicholas Lose, MD as Consulting Physician (Hematology and Oncology) Gery Pray, MD as Consulting Physician (Radiation Oncology)  DIAGNOSIS:  Encounter Diagnosis  Name Primary?  . 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 breast of female, estrogen receptor positive ([place])     SUMMARY OF ONCOLOGIC HISTORY:   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 breast of female, estrogen receptor positive ([place])   05/23/2016 Initial Diagnosis    Screening detected right breast distortion and left breast mass (fibroglandular tissue six-month follow-up recommended); ultrasound 1.1 cm right breast biopsy: Grade 2 invasive ductal carcinoma with DCIS, perineural invasion present, ER 70%, PR 90%, Ki-67 10%, HER-2 negative ratio 1.45, T1 BN 0 stage I a clinical stage      06/08/2016 Genetic Testing    Negative genetic testing on the [facility] STAT panel.  The STAT Breast cancer panel offered by [facility] includes sequencing and rearrangement analysis for the following 9 genes:  ATM, BRCA1, BRCA2, CDH1, CHEK2, PALB2, PTEN, STK11 and TP53.   The report date is Jun 08, 2016.       07/14/2016 Surgery    Right lumpectomy: 1.7 cm IDC grade 2 with calcifications, margins negative,PASH, additional inferior margin excision microscopic focus of residual cancer 0.4 cm, 0/4 lymph nodes negative, ER 70%, PR 90%, HER-2 negative, Ki-67 10%, T1 CN 0 stage IA      07/14/2016 Oncotype testing    Oncotype DX score 13: Risk of recurrence 8%      08/18/2016 - 10/11/2016 Radiation Therapy    Adjuvant radiation therapy at [facility] with Dr. Orlene Erm      08/18/2016 -  Anti-estrogen oral therapy    Tamoxifen 20 mg daily      CHIEF COMPLIANT: Follow-up on tamoxifen therapy  INTERVAL HISTORY: Dorothy Crosby is a 41 year old with above-mentioned is a right  breast cancer treated with lumpectomy followed by adjuvant radiation therapy and is currently on tamoxifen. She appears been tolerating tamoxifen extremely well. She does not have any hot flashes or night sweats or myalgias. She has some redness related to prior radiation but otherwise healing very quickly.  REVIEW OF SYSTEMS:   Constitutional: Denies fevers, chills or abnormal weight loss Eyes: Denies blurriness of vision Ears, nose, mouth, throat, and face: Denies mucositis or sore throat Respiratory: Denies cough, dyspnea or wheezes Cardiovascular: Denies palpitation, chest discomfort Gastrointestinal:  Denies nausea, heartburn or change in bowel habits Skin: Denies abnormal skin rashes Lymphatics: Denies new lymphadenopathy or easy bruising Neurological:Denies numbness, tingling or new weaknesses Behavioral/Psych: Mood is stable, no new changes  Extremities: No lower extremity edema Breast: Radiation dermatitis All other systems were reviewed with the patient and are negative.  I have reviewed the past medical history, past surgical history, social history and family history with the patient and they are unchanged from previous note.  ALLERGIES:  has No Known Allergies.  MEDICATIONS:  Current Outpatient Prescriptions  Medication Sig Dispense Refill  . BIOTIN 5000 PO Take 1 tablet by mouth daily.    . Lactobacillus (PROBIOTIC ACIDOPHILUS PO) Take 1 tablet by mouth daily.    . Multiple Vitamin (MULTIVITAMIN) tablet Take 1 tablet by mouth daily.    Marland Kitchen oxyCODONE-acetaminophen (PERCOCET) 10-325 MG tablet Take 1 tablet by mouth every 6 (six) hours as needed for pain. 20 tablet 0  . tamoxifen (NOLVADEX) 20 MG tablet Take 1 tablet (20 mg  total) by mouth daily. 90 tablet 3   No current facility-administered medications for this visit.     PHYSICAL EXAMINATION: ECOG PERFORMANCE STATUS: 1 - Symptomatic but completely ambulatory  Vitals:   10/18/16 1526  BP: (!) 110/58  Pulse: 81    Resp: 18  Temp: 98.3 F (36.8 C)  SpO2: 98%   Filed Weights   10/18/16 1526  Weight: 153 lb 11.2 oz (69.7 kg)    GENERAL:alert, no distress and comfortable SKIN: skin color, texture, turgor are normal, no rashes or significant lesions EYES: normal, Conjunctiva are pink and non-injected, sclera clear OROPHARYNX:no exudate, no erythema and lips, buccal mucosa, and tongue normal  NECK: supple, thyroid normal size, non-tender, without nodularity LYMPH:  no palpable lymphadenopathy in the cervical, axillary or inguinal LUNGS: clear to auscultation and percussion with normal breathing effort HEART: regular rate & rhythm and no murmurs and no lower extremity edema ABDOMEN:abdomen soft, non-tender and normal bowel sounds MUSCULOSKELETAL:no cyanosis of digits and no clubbing  NEURO: alert & oriented x 3 with fluent speech, no focal motor/sensory deficits EXTREMITIES: No lower extremity edema   LABORATORY DATA:  I have reviewed the data as listed   Chemistry      Component Value Date/Time   NA 143 06/01/2016 1211   K 4.0 06/01/2016 1211   CO2 30 (H) 06/01/2016 1211   BUN 11.2 06/01/2016 1211   CREATININE 1.1 06/01/2016 1211      Component Value Date/Time   CALCIUM 8.9 06/01/2016 1211   ALKPHOS 52 06/01/2016 1211   AST 18 06/01/2016 1211   ALT 13 06/01/2016 1211   BILITOT 1.12 06/01/2016 1211       Lab Results  Component Value Date   WBC 6.3 06/01/2016   HGB 14.0 06/01/2016   HCT 41.1 06/01/2016   MCV 89.6 06/01/2016   PLT 233 06/01/2016   NEUTROABS 4.0 06/01/2016    ASSESSMENT & PLAN:  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 breast of female, estrogen receptor positive ([place]) 07/14/2016: Right lumpectomy: 1.7 cm IDC grade 2 with calcifications, margins negative,PASH, additional inferior margin excision microscopic focus of residual cancer 0.4 cm, 0/4 lymph nodes negative, ER 70%, PR 90%, HER-2 negative, Ki-67 10%, T1 CN 0 stage IA Oncotype DX score 13:8%  risk of distant recurrence with tamoxifen alone  Adjuvant radiation therapy [place] 08/19/2016 completed 10/11/2016  Treatment plan: Adjuvant antiestrogen therapy with tamoxifen 20 mg daily 10 years Patient was previously taking tamoxifen and had tolerated it very well.  Return to clinic in 6 months for follow-up After that we can see her once a year    I spent 25 minutes talking to the patient of which more than half was spent in counseling and coordination of care.  No orders of the defined types were placed in this encounter.  The patient has a good understanding of the overall plan. she agrees with it. she will call with any problems that may develop before the next visit here.   Rulon Eisenmenger, MD 10/18/16

## 2017-04-18 NOTE — Assessment & Plan Note (Signed)
07/14/2016: Right lumpectomy: 1.7 cm IDC grade 2 with calcifications, margins negative,PASH, additional inferior margin excision microscopic focus of residual cancer 0.4 cm, 0/4 lymph nodes negative, ER 70%, PR 90%, HER-2 negative, Ki-67 10%, T1 CN 0 stage IA Oncotype DX score 13:8% risk of distant recurrence with tamoxifen alone  Adjuvant radiation therapy [place] 08/19/2016 completed 10/11/2016  Treatment plan: Adjuvant antiestrogen therapy with tamoxifen 20 mg daily 10 years Patient was previously taking tamoxifen and had tolerated it very well.  Surveillance: 1. Breast exam: 2. Mammogram annually  Return to clinic in 1 yr for follow-up

## 2017-04-19 ENCOUNTER — Inpatient Hospital Stay: Payer: BC Managed Care – PPO | Attending: Hematology and Oncology | Admitting: Hematology and Oncology

## 2017-04-19 ENCOUNTER — Telehealth: Payer: Self-pay | Admitting: Hematology and Oncology

## 2017-04-19 DIAGNOSIS — C50511 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 female breast: Secondary | ICD-10-CM | POA: Insufficient documentation

## 2017-04-19 DIAGNOSIS — Z79811 Long term (current) use of aromatase inhibitors: Secondary | ICD-10-CM | POA: Insufficient documentation

## 2017-04-19 DIAGNOSIS — Z923 Personal history of irradiation: Secondary | ICD-10-CM

## 2017-04-19 DIAGNOSIS — Z170 Estrogen receptor positive status [ER+]: Secondary | ICD-10-CM | POA: Diagnosis not present

## 2017-04-19 MED ORDER — TAMOXIFEN CITRATE 20 MG PO TABS: 20.0000 mg | ORAL_TABLET | Freq: Every day | ORAL | 3 refills | Status: DC

## 2017-04-19 NOTE — Progress Notes (Signed)
Patient Care Team: Myrlene Broker, MD as PCP - General (Family Medicine) Rolm Bookbinder, MD as Consulting Physician (General Surgery) Nicholas Lose, MD as Consulting Physician (Hematology and Oncology) Gery Pray, MD as Consulting Physician (Radiation Oncology)  DIAGNOSIS:  Encounter Diagnosis  Name Primary?  . 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 breast of female, estrogen receptor positive ([place])     SUMMARY OF ONCOLOGIC HISTORY:   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 breast of female, estrogen receptor positive ([place])   05/23/2016 Initial Diagnosis    Screening detected right breast distortion and left breast mass (fibroglandular tissue six-month follow-up recommended); ultrasound 1.1 cm right breast biopsy: Grade 2 invasive ductal carcinoma with DCIS, perineural invasion present, ER 70%, PR 90%, Ki-67 10%, HER-2 negative ratio 1.45, T1 BN 0 stage I a clinical stage      06/08/2016 Genetic Testing    Negative genetic testing on the [facility] STAT panel.  The STAT Breast cancer panel offered by [facility] includes sequencing and rearrangement analysis for the following 9 genes:  ATM, BRCA1, BRCA2, CDH1, CHEK2, PALB2, PTEN, STK11 and TP53.   The report date is Jun 08, 2016.       07/14/2016 Surgery    Right lumpectomy: 1.7 cm IDC grade 2 with calcifications, margins negative,PASH, additional inferior margin excision microscopic focus of residual cancer 0.4 cm, 0/4 lymph nodes negative, ER 70%, PR 90%, HER-2 negative, Ki-67 10%, T1 CN 0 stage IA      07/14/2016 Oncotype testing    Oncotype DX score 13: Risk of recurrence 8%      08/18/2016 - 10/11/2016 Radiation Therapy    Adjuvant radiation therapy at [facility] with Dr. Orlene Erm      08/18/2016 -  Anti-estrogen oral therapy    Tamoxifen 20 mg daily       CHIEF COMPLIANT: Follow-up on tamoxifen therapy  INTERVAL HISTORY: Dorothy Crosby is a 42 year old with above-mentioned his right  breast cancer treated with lumpectomy and radiation is currently on tamoxifen.  She has been tolerating tamoxifen extremely well.  She denies any hot flashes or myalgias.  She is still very sore in the breast from prior surgery and radiation.  REVIEW OF SYSTEMS:   Constitutional: Denies fevers, chills or abnormal weight loss Eyes: Denies blurriness of vision Ears, nose, mouth, throat, and face: Denies mucositis or sore throat Respiratory: Denies cough, dyspnea or wheezes Cardiovascular: Denies palpitation, chest discomfort Gastrointestinal:  Denies nausea, heartburn or change in bowel habits Skin: Denies abnormal skin rashes Lymphatics: Denies new lymphadenopathy or easy bruising Neurological:Denies numbness, tingling or new weaknesses Behavioral/Psych: Mood is stable, no new changes  Extremities: No lower extremity edema Breast: Soreness in the right breast from prior surgery and radiation All other systems were reviewed with the patient and are negative.  I have reviewed the past medical history, past surgical history, social history and family history with the patient and they are unchanged from previous note.  ALLERGIES:  has No Known Allergies.  MEDICATIONS:  Current Outpatient Medications  Medication Sig Dispense Refill  . BIOTIN 5000 PO Take 1 tablet by mouth daily.    . Lactobacillus (PROBIOTIC ACIDOPHILUS PO) Take 1 tablet by mouth daily.    . Multiple Vitamin (MULTIVITAMIN) tablet Take 1 tablet by mouth daily.    Marland Kitchen oxyCODONE-acetaminophen (PERCOCET) 10-325 MG tablet Take 1 tablet by mouth every 6 (six) hours as needed for pain. 20 tablet 0  . tamoxifen (NOLVADEX) 20 MG tablet Take 1 tablet (  20 mg total) by mouth daily. 90 tablet 3   No current facility-administered medications for this visit.     PHYSICAL EXAMINATION: ECOG PERFORMANCE STATUS: 1 - Symptomatic but completely ambulatory  Vitals:   04/19/17 1535  BP: (!) 112/44  Pulse: 80  Resp: 19  Temp: 99.3 F (37.4  C)  SpO2: 99%   Filed Weights   04/19/17 1535  Weight: 145 lb 14.4 oz (66.2 kg)    GENERAL:alert, no distress and comfortable SKIN: skin color, texture, turgor are normal, no rashes or significant lesions EYES: normal, Conjunctiva are pink and non-injected, sclera clear OROPHARYNX:no exudate, no erythema and lips, buccal mucosa, and tongue normal  NECK: supple, thyroid normal size, non-tender, without nodularity LYMPH:  no palpable lymphadenopathy in the cervical, axillary or inguinal LUNGS: clear to auscultation and percussion with normal breathing effort HEART: regular rate & rhythm and no murmurs and no lower extremity edema ABDOMEN:abdomen soft, non-tender and normal bowel sounds MUSCULOSKELETAL:no cyanosis of digits and no clubbing  NEURO: alert & oriented x 3 with fluent speech, no focal motor/sensory deficits EXTREMITIES: No lower extremity edema  LABORATORY DATA:  I have reviewed the data as listed CMP Latest Ref Rng & Units 06/01/2016  Glucose 70 - 140 mg/dl 83  BUN 7.0 - 26.0 mg/dL 11.2  Creatinine 0.6 - 1.1 mg/dL 1.1  Sodium 136 - 145 mEq/L 143  Potassium 3.5 - 5.1 mEq/L 4.0  CO2 22 - 29 mEq/L 30(H)  Calcium 8.4 - 10.4 mg/dL 8.9  Total Protein 6.4 - 8.3 g/dL 7.2  Total Bilirubin 0.20 - 1.20 mg/dL 1.12  Alkaline Phos 40 - 150 U/L 52  AST 5 - 34 U/L 18  ALT 0 - 55 U/L 13    Lab Results  Component Value Date   WBC 6.3 06/01/2016   HGB 14.0 06/01/2016   HCT 41.1 06/01/2016   MCV 89.6 06/01/2016   PLT 233 06/01/2016   NEUTROABS 4.0 06/01/2016    ASSESSMENT & PLAN:  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 breast of female, estrogen receptor positive ([place]) 07/14/2016: Right lumpectomy: 1.7 cm IDC grade 2 with calcifications, margins negative,PASH, additional inferior margin excision microscopic focus of residual cancer 0.4 cm, 0/4 lymph nodes negative, ER 70%, PR 90%, HER-2 negative, Ki-67 10%, T1 CN 0 stage IA Oncotype DX score 13:8% risk of distant  recurrence with tamoxifen alone  Adjuvant radiation therapy [place] 08/19/2016 completed 10/11/2016  Treatment plan: Adjuvant antiestrogen therapy with tamoxifen 20 mg daily 10 years Patient was previously taking tamoxifen and had tolerated it very well. Tamoxifen toxicities: Patient denies any hot flashes or myalgias.  She tells me that her periods have slowed down since she started tamoxifen. Surveillance: 1. Breast exam: February 2019  2. Mammogram  to be done in May 2019 Return to clinic in 1 yr for follow-up   No orders of the defined types were placed in this encounter.  The patient has a good understanding of the overall plan. she agrees with it. she will call with any problems that may develop before the next visit here.   Harriette Ohara, MD 04/19/17

## 2017-04-19 NOTE — Telephone Encounter (Signed)
Gave patient AVs and calendar of upcoming April 2020 appointments.  °

## 2017-05-31 ENCOUNTER — Other Ambulatory Visit: Payer: Self-pay

## 2017-05-31 DIAGNOSIS — Z170 Estrogen receptor positive status [ER+]: Principal | ICD-10-CM

## 2017-05-31 DIAGNOSIS — C50511 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 female breast: Secondary | ICD-10-CM

## 2017-05-31 NOTE — Progress Notes (Signed)
Pt called to see if she can get her mammograms done at the breast center. Pt due in May 2019. Will place order and told pt to call the breast center to scheduled for a time that works for her schedule.

## 2017-06-22 ENCOUNTER — Ambulatory Visit
Admission: RE | Admit: 2017-06-22 | Discharge: 2017-06-22 | Disposition: A | Discharge status: Home / Self Care | Payer: BC Managed Care – PPO | Source: Ambulatory Visit | Attending: Hematology and Oncology | Admitting: Hematology and Oncology

## 2017-06-22 DIAGNOSIS — Z170 Estrogen receptor positive status [ER+]: Principal | ICD-10-CM

## 2017-06-22 DIAGNOSIS — C50511 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 female breast: Secondary | ICD-10-CM

## 2017-12-21 IMAGING — MG MM CLIP PLACEMENT
2 series · 2 of 2 positions shown · non-contrast
Comparison: Previous exam(s).

CLINICAL DATA: Post biopsy mammogram of the left breast for clip
placement.

EXAM:
DIAGNOSTIC LEFT MAMMOGRAM POST MRI BIOPSY

[L ML]
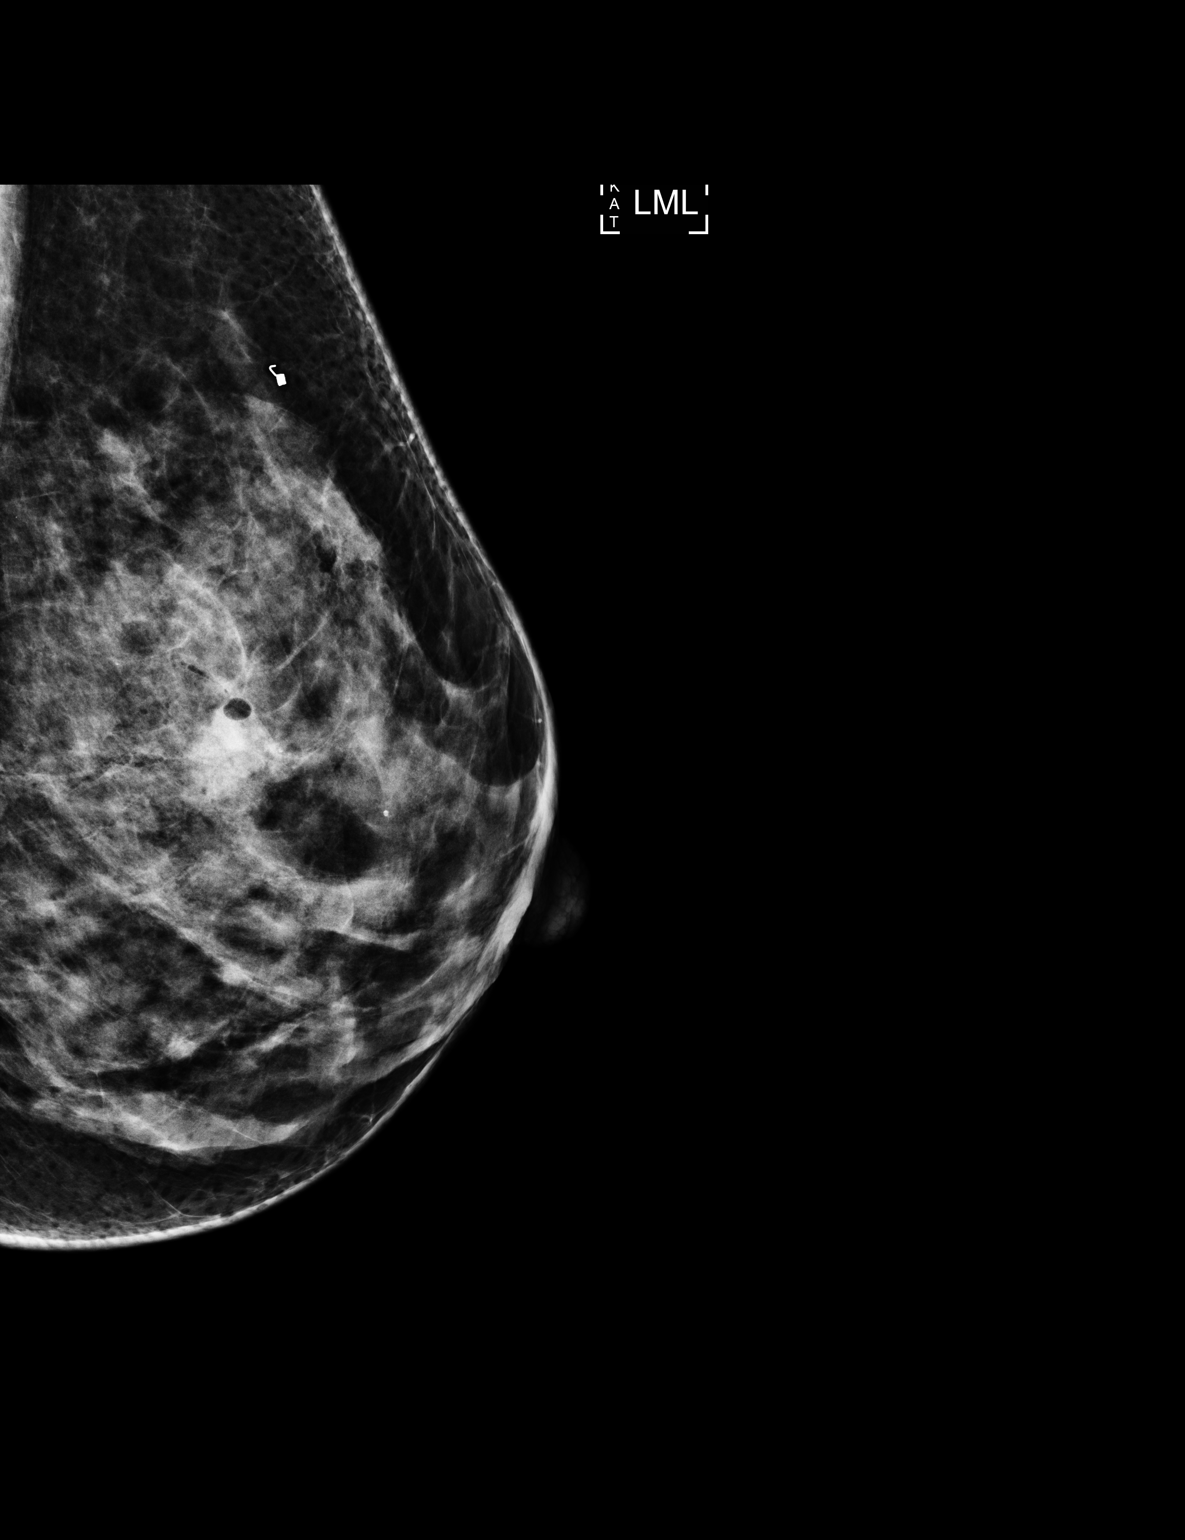

[L CC]
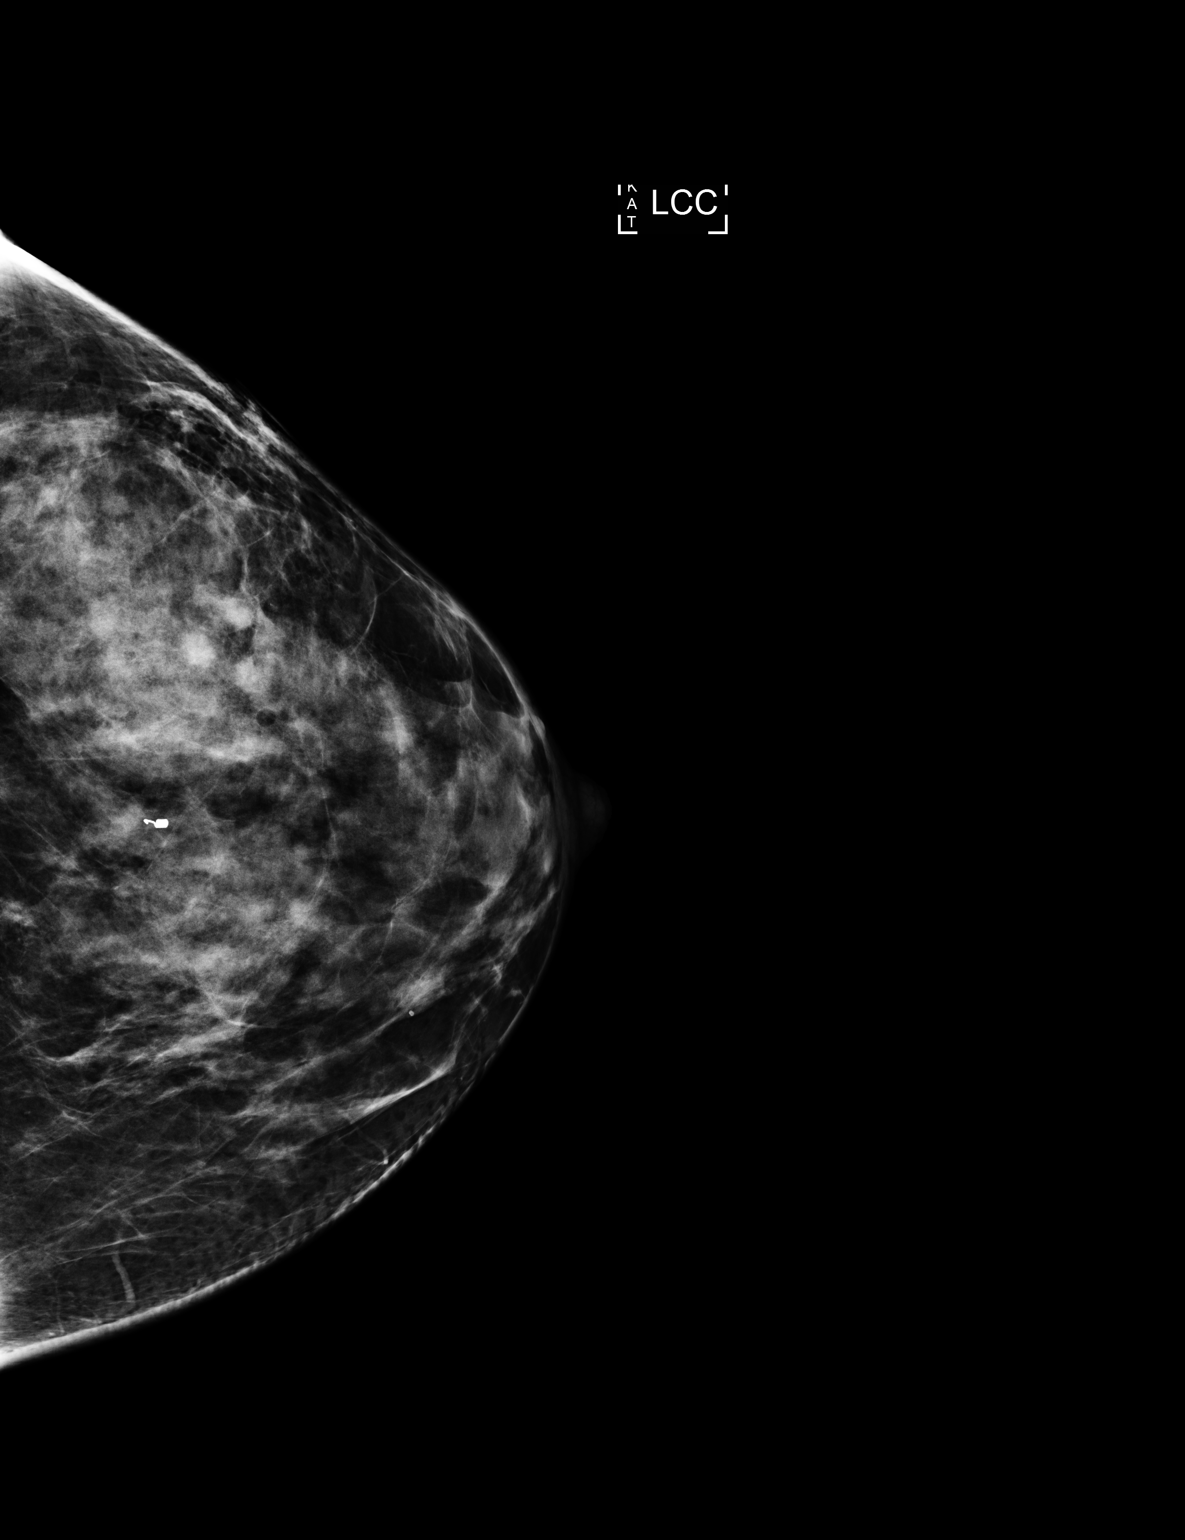

[2 of 2 positions shown; findings below may reference images not displayed]

FINDINGS: Mammographic images were obtained following MRI guided biopsy of non
mass enhancement in the upper-outer left breast. The coil shaped
biopsy marking clip is likely approximately 3 cm medially displaced
from the biopsied site in the left breast judging from the location
of the lesion on the MRI.
IMPRESSION: 1. The coil shaped biopsy marking clip is likely approximately 3 cm
medially displaced from the site of biopsy in the left breast.

Final Assessment: Post Procedure Mammograms for Marker Placement

## 2018-01-08 IMAGING — MG MM PLC BREAST LOC DEV 1ST LESION INC*R*
8 of 10 series · 8 of 10 positions shown · non-contrast
Comparison: Previous exam(s).

CLINICAL DATA: Biopsy-proven invasive ductal carcinoma involving
the lower outer quadrant of the right breast. Radioactive seed
localization is performed in anticipation of lumpectomy on
07/15/2015.

EXAM:
MAMMOGRAPHIC GUIDED RADIOACTIVE SEED LOCALIZATION OF THE RIGHT
BREAST

[R LM (1 of 4)]
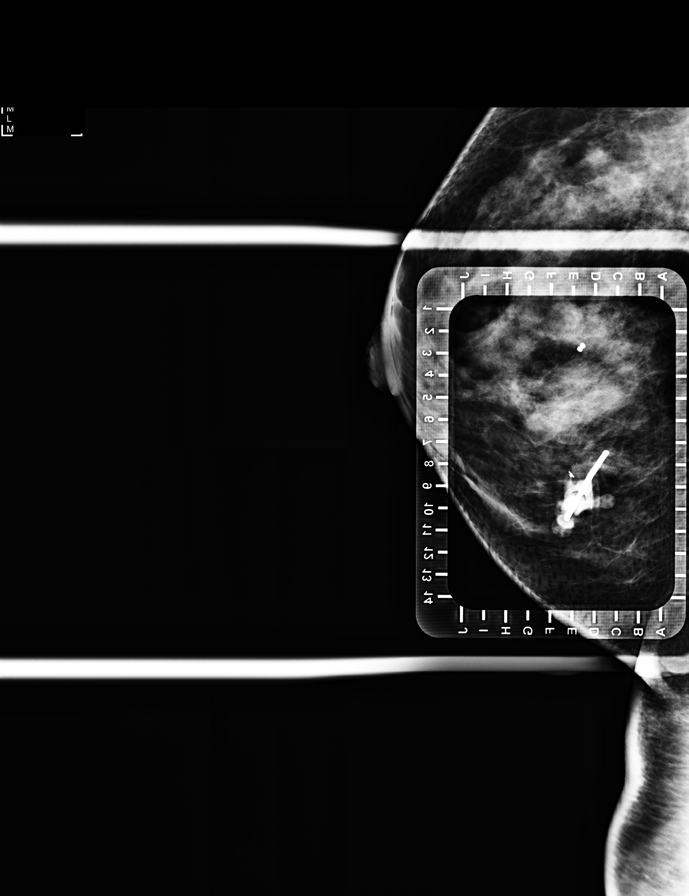

[R LM (2 of 4)]
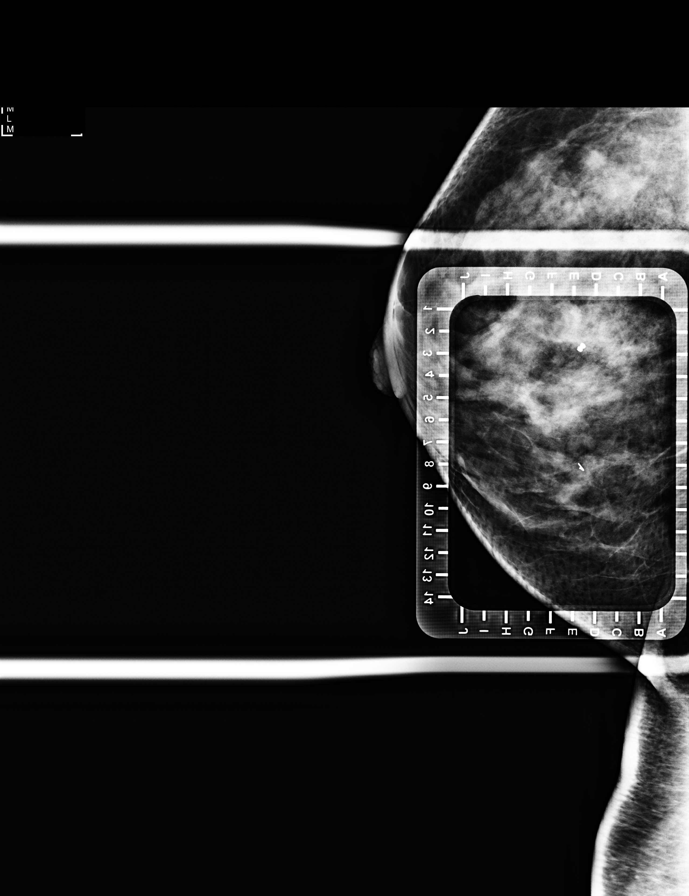

[R CC (1 of 4)]
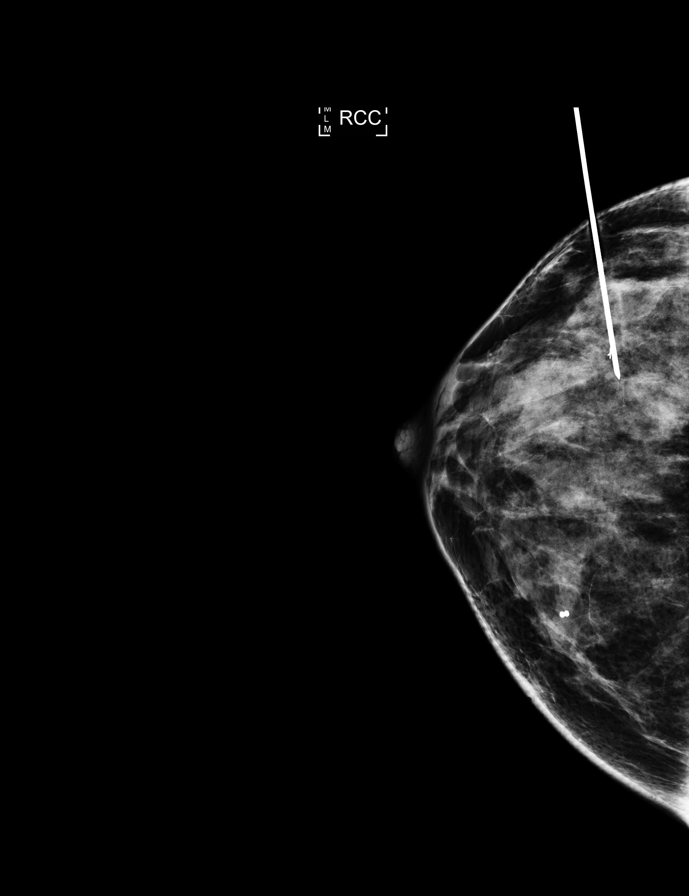

[R CC (2 of 4)]
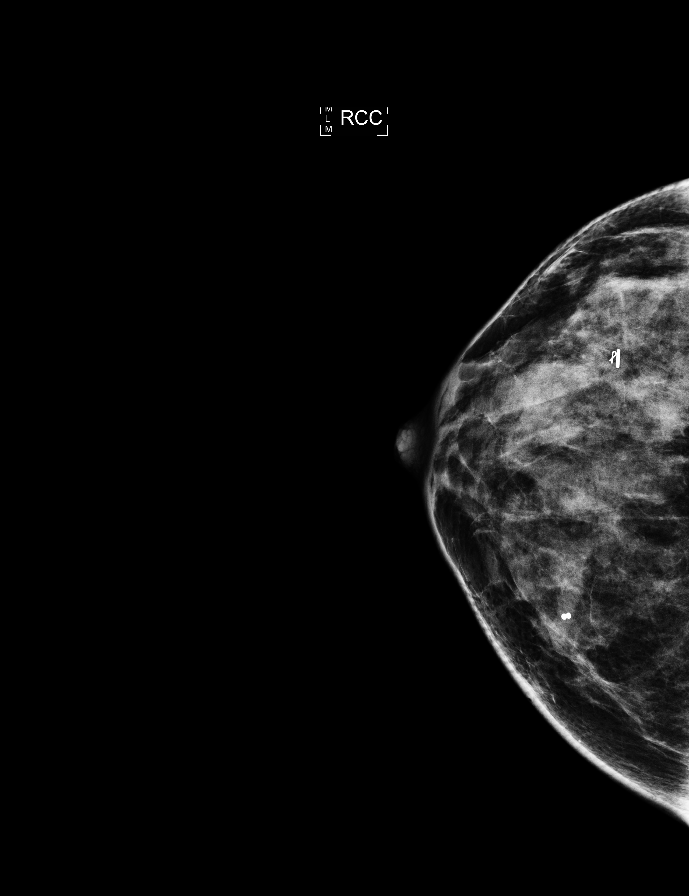

[R LM (3 of 4)]
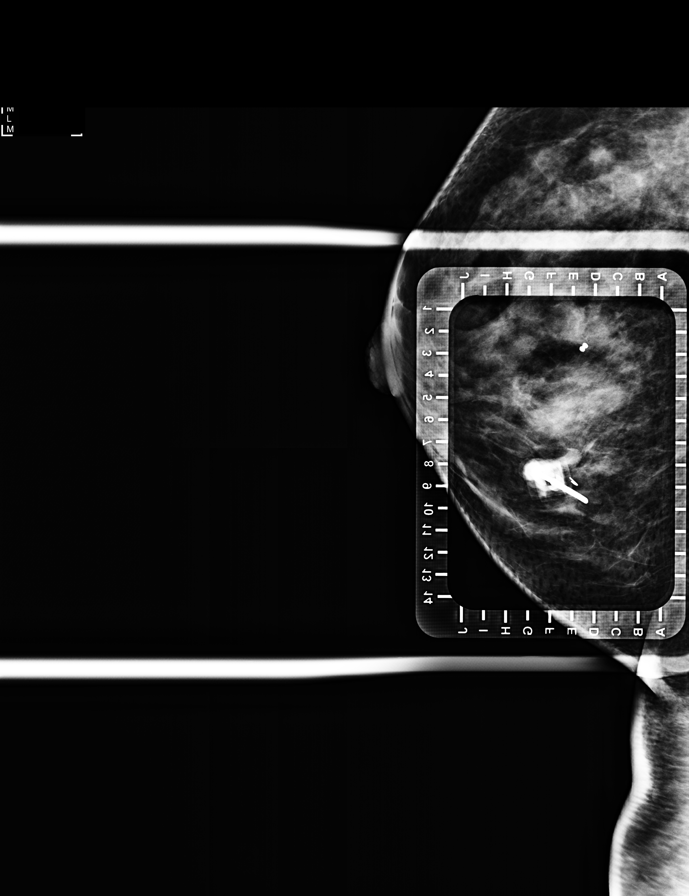

[R CC (3 of 4)]
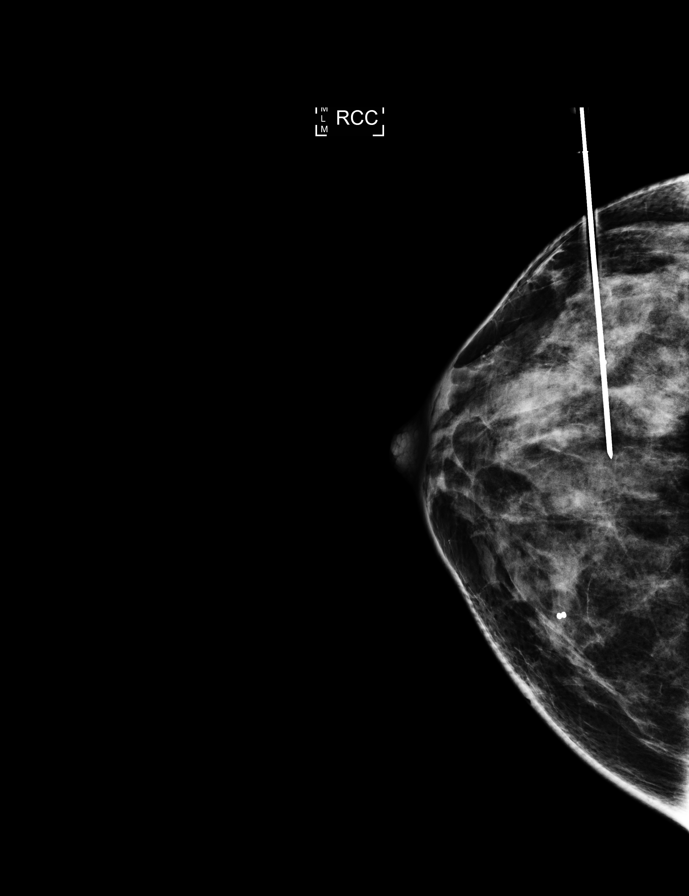

[R LM (4 of 4)]
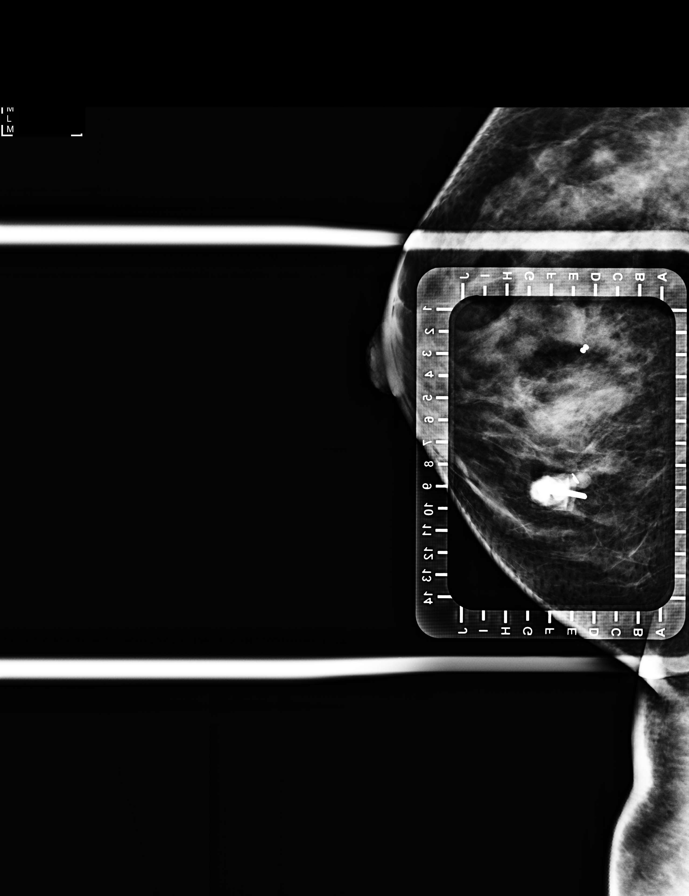

[R CC (4 of 4)]
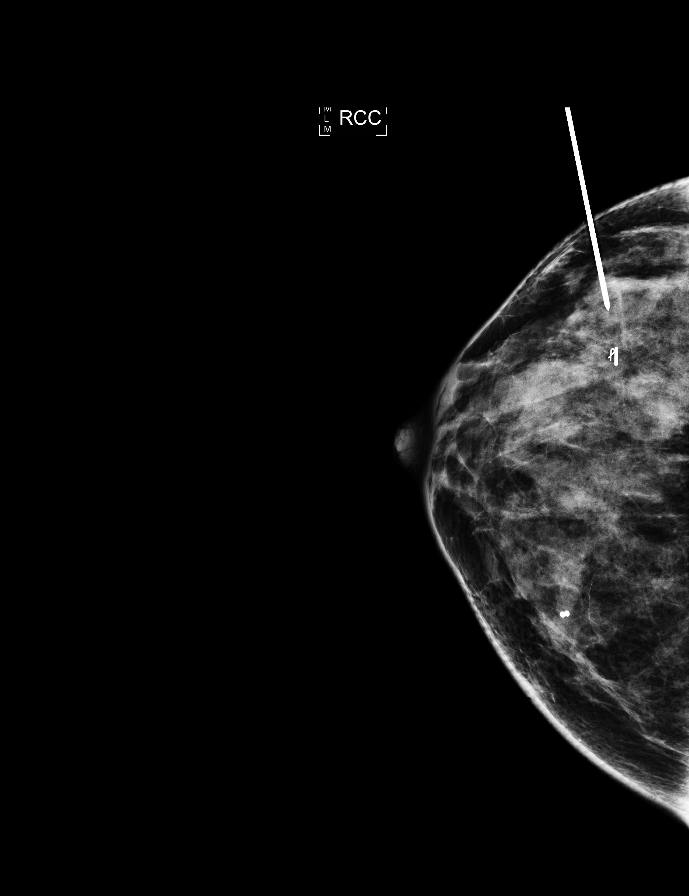

[8 of 10 positions shown; findings below may reference images not displayed]

FINDINGS: Patient presents for radioactive seed localization prior to right
breast lumpectomy. I met with the patient and we discussed the
procedure of seed localization including benefits and alternatives.
We discussed the high likelihood of a successful procedure. We
discussed the risks of the procedure including infection, bleeding,
tissue injury and further surgery. We discussed the low dose of
radioactivity involved in the procedure. Informed, written consent
was given.

The usual time-out protocol was performed immediately prior to the
procedure.

Using mammographic guidance, sterile technique with chlorhexidine as
skin antisepsis, 1% lidocaine as local anesthetic, an [id number]
radioactive seed was used to localize the ribbon shaped tissue
marker clip associated with the biopsy-proven invasive ductal
carcinoma in the lower outer quadrant of the right breast using a
lateral approach. The follow-up mammogram images confirm the seed in
the expected location, located approximately 2 mm inferior to the
ribbon shaped clip, and the images were marked for Dr. Anton.

Follow-up survey of the patient confirms the presence of the
radioactive seed.

Order number of [id number] seed:  [id number]

Total activity: 0.252 mCi  Reference Date: 07/12/2016

The patient tolerated the procedure well and was released from the
[REDACTED]. She was given instructions regarding seed removal.
IMPRESSION: Radioactive seed localization of the biopsy-proven invasive ductal
carcinoma involving the lower outer quadrant of the right breast. No
apparent complications.

## 2018-03-27 ENCOUNTER — Telehealth: Payer: Self-pay | Admitting: Hematology and Oncology

## 2018-03-27 NOTE — Telephone Encounter (Signed)
VG PM PAL 4/8 moved f/u to AM. Left message schedule mailed.

## 2018-04-16 NOTE — Progress Notes (Signed)
HEMATOLOGY-ONCOLOGY [place] VISIT PROGRESS NOTE  I connected with Daune Divirgilio Hoose on 04/18/2018 at  9:00 AM EDT by Webex video conference and verified that I am speaking with the correct person using two identifiers.  I discussed the limitations, risks, security and privacy concerns of performing an evaluation and management service by Webex and the availability of in person appointments.  I also discussed with the patient that there may be a patient responsible charge related to this service. The patient expressed understanding and agreed to proceed.  Patient's Location: Home Physician Location: Home Office/ WebEx   CHIEF COMPLIANT: Follow-up on tamoxifen therapy  INTERVAL HISTORY: Dorothy Crosby is a 43 y.o. with above-mentioned history of right breast cancer treated with lumpectomy and radiation is currently on tamoxifen. I last saw her one year ago. Her most recent mammogram from 06/22/17 showed no evidence of malignancy bilaterally. She presents today for annual follow-up.  She denies any major problems tolerating tamoxifen.  She denies any pain or lumps or nodules in the breast. Her mammograms were done in June 2019.    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 breast of female, estrogen receptor positive ([place])   05/23/2016 Initial Diagnosis    Screening detected right breast distortion and left breast mass (fibroglandular tissue six-month follow-up recommended); ultrasound 1.1 cm right breast biopsy: Grade 2 invasive ductal carcinoma with DCIS, perineural invasion present, ER 70%, PR 90%, Ki-67 10%, HER-2 negative ratio 1.45, T1 BN 0 stage I a clinical stage    06/08/2016 Genetic Testing    Negative genetic testing on the [facility] STAT panel.  The STAT Breast cancer panel offered by [facility] includes sequencing and rearrangement analysis for the following 9 genes:  ATM, BRCA1, BRCA2, CDH1, CHEK2, PALB2, PTEN, STK11 and TP53.   The report date is Jun 08, 2016.     07/14/2016  Surgery    Right lumpectomy: 1.7 cm IDC grade 2 with calcifications, margins negative,PASH, additional inferior margin excision microscopic focus of residual cancer 0.4 cm, 0/4 lymph nodes negative, ER 70%, PR 90%, HER-2 negative, Ki-67 10%, T1 CN 0 stage IA    07/14/2016 Oncotype testing    Oncotype DX score 13: Risk of recurrence 8%    08/18/2016 - 10/11/2016 Radiation Therapy    Adjuvant radiation therapy at [facility] with Dr. Orlene Erm    08/18/2016 -  Anti-estrogen oral therapy    Tamoxifen 20 mg daily     REVIEW OF SYSTEMS:   Constitutional: Denies fevers, chills or abnormal weight loss Eyes: Denies blurriness of vision Ears, nose, mouth, throat, and face: Denies mucositis or sore throat Respiratory: Denies cough, dyspnea or wheezes Cardiovascular: Denies palpitation, chest discomfort Gastrointestinal:  Denies nausea, heartburn or change in bowel habits Skin: Denies abnormal skin rashes Lymphatics: Denies new lymphadenopathy or easy bruising Neurological:Denies numbness, tingling or new weaknesses Behavioral/Psych: Mood is stable, no new changes  Extremities: No lower extremity edema Breast:  denies any pain or lumps or nodules in either breasts All other systems were reviewed with the patient and are negative.  Observations/Objective:  There were no vitals filed for this visit. There is no height or weight on file to calculate BMI.  I have reviewed the data as listed CMP Latest Ref Rng & Units 06/01/2016  Glucose 70 - 140 mg/dl 83  BUN 7.0 - 26.0 mg/dL 11.2  Creatinine 0.6 - 1.1 mg/dL 1.1  Sodium 136 - 145 mEq/L 143  Potassium 3.5 - 5.1 mEq/L 4.0  CO2 22 -  29 mEq/L 30(H)  Calcium 8.4 - 10.4 mg/dL 8.9  Total Protein 6.4 - 8.3 g/dL 7.2  Total Bilirubin 0.20 - 1.20 mg/dL 1.12  Alkaline Phos 40 - 150 U/L 52  AST 5 - 34 U/L 18  ALT 0 - 55 U/L 13    Lab Results  Component Value Date   WBC 6.3 06/01/2016   HGB 14.0 06/01/2016   HCT 41.1 06/01/2016   MCV 89.6  06/01/2016   PLT 233 06/01/2016   NEUTROABS 4.0 06/01/2016      Assessment Plan:  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 breast of female, estrogen receptor positive ([place]) 07/14/2016: Right lumpectomy: 1.7 cm IDC grade 2 with calcifications, margins negative,PASH, additional inferior margin excision microscopic focus of residual cancer 0.4 cm, 0/4 lymph nodes negative, ER 70%, PR 90%, HER-2 negative, Ki-67 10%, T1 CN 0 stage IA Oncotype DX score 13:8% risk of distant recurrence with tamoxifen alone  Adjuvant radiation therapy [place] 08/19/2016 completed 10/11/2016  Treatment plan: Adjuvant antiestrogen therapy with tamoxifen 20 mg daily 10 years started 08/18/2016  Tamoxifen toxicities: Patient denies any hot flashes or myalgias.    Surveillance: 1. Breast exam: February 2019 , 2020 exam could not be done because this is a virtual visit 2. Mammogram  06/22/2017: Benign, breast density category D  We discussed the role of breast MRIs for surveillance.  I recommended that we obtain a breast MRI in 6 months from the mammogram because of her high breast density.  We will plan to do breast MRIs every other year. We will plan to do this MRI in January 2021.  She is worried about medical bills because she is still paying for her MRIs from the time of diagnosis.  Return to clinic in 1 yr for follow-up  I discussed the assessment and treatment plan with the patient. The patient was provided an opportunity to ask questions and all were answered. The patient agreed with the plan and demonstrated an understanding of the instructions. The patient was advised to call back or seek an in-person evaluation if the symptoms worsen or if the condition fails to improve as anticipated.   I provided 15 minutes of face-to-face Web Ex time during this encounter.    Harriette Ohara, MD  04/18/2018

## 2018-04-17 NOTE — Assessment & Plan Note (Signed)
07/14/2016: Right lumpectomy: 1.7 cm IDC grade 2 with calcifications, margins negative,PASH, additional inferior margin excision microscopic focus of residual cancer 0.4 cm, 0/4 lymph nodes negative, ER 70%, PR 90%, HER-2 negative, Ki-67 10%, T1 CN 0 stage IA Oncotype DX score 13:8% risk of distant recurrence with tamoxifen alone  Adjuvant radiation therapy [facility] 08/19/2016 completed 10/11/2016  Treatment plan: Adjuvant antiestrogen therapy with tamoxifen 20 mg daily 10 years started 08/18/2016  Tamoxifen toxicities: Patient denies any hot flashes or myalgias.  She tells me that her periods have slowed down since she started tamoxifen.  Surveillance: 1. Breast exam: February 2019 , 2020 exam could not be done because this is a virtual visit 2. Mammogram  06/22/2017: Benign, breast density category D  We discussed the role of breast MRIs for surveillance.  I recommended that we obtain a breast MRI in 6 months from the mammogram because of her high breast density.  We will plan to do breast MRIs every other year.  Return to clinic in 1 yr for follow-up 

## 2018-04-18 ENCOUNTER — Ambulatory Visit (HOSPITAL_BASED_OUTPATIENT_CLINIC_OR_DEPARTMENT_OTHER): Payer: BC Managed Care – PPO | Admitting: Hematology and Oncology

## 2018-04-18 DIAGNOSIS — Z1231 Encounter for screening mammogram for malignant neoplasm of breast: Secondary | ICD-10-CM | POA: Diagnosis not present

## 2018-04-18 DIAGNOSIS — Z170 Estrogen receptor positive status [ER+]: Secondary | ICD-10-CM | POA: Diagnosis not present

## 2018-04-18 DIAGNOSIS — Z923 Personal history of irradiation: Secondary | ICD-10-CM

## 2018-04-18 DIAGNOSIS — C50511 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 female breast: Secondary | ICD-10-CM | POA: Diagnosis not present

## 2018-04-18 DIAGNOSIS — Z79810 Long term (current) use of selective estrogen receptor modulators (SERMs): Secondary | ICD-10-CM

## 2018-04-18 MED ORDER — TAMOXIFEN CITRATE 20 MG PO TABS
20.0000 mg | ORAL_TABLET | Freq: Every day | ORAL | 3 refills | Status: DC
Start: 2018-04-18 — End: 2018-12-31

## 2018-04-18 MED ORDER — B-50 COMPLEX PO TABS: 1.0000 | ORAL_TABLET | Freq: Every day | ORAL | 0 refills | Status: DC

## 2018-05-22 ENCOUNTER — Other Ambulatory Visit: Payer: Self-pay | Admitting: Hematology and Oncology

## 2018-05-22 DIAGNOSIS — Z853 Personal history of malignant neoplasm of breast: Secondary | ICD-10-CM

## 2018-06-25 ENCOUNTER — Ambulatory Visit
Admission: RE | Admit: 2018-06-25 | Discharge: 2018-06-25 | Disposition: A | Discharge status: Home / Self Care | Payer: BC Managed Care – PPO | Source: Ambulatory Visit | Attending: Hematology and Oncology | Admitting: Hematology and Oncology

## 2018-06-25 ENCOUNTER — Other Ambulatory Visit: Payer: Self-pay

## 2018-06-25 DIAGNOSIS — Z853 Personal history of malignant neoplasm of breast: Secondary | ICD-10-CM

## 2018-07-04 ENCOUNTER — Telehealth: Payer: Self-pay | Admitting: Hematology and Oncology

## 2018-07-04 NOTE — Telephone Encounter (Signed)
Scheduled appt per 6/19 sch message - unable to reach pt. Left message and mailed reminder letter  With appt date and time

## 2018-12-30 NOTE — Progress Notes (Signed)
Patient Care Team: Mateo Flow, MD as PCP - General (Family Medicine) Rolm Bookbinder, MD as Consulting Physician (General Surgery) Nicholas Lose, MD as Consulting Physician (Hematology and Oncology) Gery Pray, MD as Consulting Physician (Radiation Oncology)  DIAGNOSIS:    ICD-10-CM   1. 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 breast of female, estrogen receptor positive ([place])  C50.511    Z17.0     SUMMARY OF ONCOLOGIC HISTORY: Oncology History  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 breast of female, estrogen receptor positive ([place])  05/23/2016 Initial Diagnosis   Screening detected right breast distortion and left breast mass (fibroglandular tissue six-month follow-up recommended); ultrasound 1.1 cm right breast biopsy: Grade 2 invasive ductal carcinoma with DCIS, perineural invasion present, ER 70%, PR 90%, Ki-67 10%, HER-2 negative ratio 1.45, T1 BN 0 stage I a clinical stage   06/08/2016 Genetic Testing   Negative genetic testing on the [facility] STAT panel.  The STAT Breast cancer panel offered by [facility] includes sequencing and rearrangement analysis for the following 9 genes:  ATM, BRCA1, BRCA2, CDH1, CHEK2, PALB2, PTEN, STK11 and TP53.   The report date is Jun 08, 2016.    07/14/2016 Surgery   Right lumpectomy: 1.7 cm IDC grade 2 with calcifications, margins negative,PASH, additional inferior margin excision microscopic focus of residual cancer 0.4 cm, 0/4 lymph nodes negative, ER 70%, PR 90%, HER-2 negative, Ki-67 10%, T1 CN 0 stage IA   07/14/2016 Oncotype testing   Oncotype DX score 13: Risk of recurrence 8%   08/18/2016 - 10/11/2016 Radiation Therapy   Adjuvant radiation therapy at [facility] with Dr. Orlene Erm   08/18/2016 -  Anti-estrogen oral therapy   Tamoxifen 20 mg daily     CHIEF COMPLIANT: Follow-up of right breast cancer on tamoxifen therapy  INTERVAL HISTORY: Dorothy Crosby is a 42 y.o. with above-mentioned history of  right breast cancer treated with lumpectomy, radiation, and who is currently on tamoxifen. Mammogram on 06/25/18 showed no evidence of malignancy bilaterally. She presents today for annual follow-up.   REVIEW OF SYSTEMS:   Constitutional: Denies fevers, chills or abnormal weight loss Eyes: Denies blurriness of vision Ears, nose, mouth, throat, and face: Denies mucositis or sore throat Respiratory: Denies cough, dyspnea or wheezes Cardiovascular: Denies palpitation, chest discomfort Gastrointestinal: Denies nausea, heartburn or change in bowel habits Skin: Denies abnormal skin rashes Lymphatics: Denies new lymphadenopathy or easy bruising Neurological: Denies numbness, tingling or new weaknesses Behavioral/Psych: Mood is stable, no new changes  Extremities: No lower extremity edema Breast: denies any pain or lumps or nodules in either breasts All other systems were reviewed with the patient and are negative.  I have reviewed the past medical history, past surgical history, social history and family history with the patient and they are unchanged from previous note.  ALLERGIES:  has No Known Allergies.  MEDICATIONS:  Current Outpatient Medications  Medication Sig Dispense Refill  . Multiple Vitamin (MULTIVITAMIN) tablet Take 1 tablet by mouth daily.    . tamoxifen (NOLVADEX) 20 MG tablet Take 1 tablet (20 mg total) by mouth daily. 90 tablet 3  . Vitamins-Lipotropics (B-50 COMPLEX) TABS Take 1 tablet by mouth daily.  0   No current facility-administered medications for this visit.    PHYSICAL EXAMINATION: ECOG PERFORMANCE STATUS: 1 - Symptomatic but completely ambulatory  Vitals:   12/31/18 1548  BP: (!) 102/53  Pulse: 93  Resp: 18  Temp: 98.2 F (36.8 C)  SpO2: 99%   Filed Weights  12/31/18 1548  Weight: 153 lb 12.8 oz (69.8 kg)    GENERAL: alert, no distress and comfortable SKIN: skin color, texture, turgor are normal, no rashes or significant lesions EYES: normal,  Conjunctiva are pink and non-injected, sclera clear OROPHARYNX: no exudate, no erythema and lips, buccal mucosa, and tongue normal  NECK: supple, thyroid normal size, non-tender, without nodularity LYMPH: no palpable lymphadenopathy in the cervical, axillary or inguinal LUNGS: clear to auscultation and percussion with normal breathing effort HEART: regular rate & rhythm and no murmurs and no lower extremity edema ABDOMEN: abdomen soft, non-tender and normal bowel sounds MUSCULOSKELETAL: no cyanosis of digits and no clubbing  NEURO: alert & oriented x 3 with fluent speech, no focal motor/sensory deficits EXTREMITIES: No lower extremity edema  LABORATORY DATA:  I have reviewed the data as listed CMP Latest Ref Rng & Units 06/01/2016  Glucose 70 - 140 mg/dl 83  BUN 7.0 - 26.0 mg/dL 11.2  Creatinine 0.6 - 1.1 mg/dL 1.1  Sodium 136 - 145 mEq/L 143  Potassium 3.5 - 5.1 mEq/L 4.0  CO2 22 - 29 mEq/L 30(H)  Calcium 8.4 - 10.4 mg/dL 8.9  Total Protein 6.4 - 8.3 g/dL 7.2  Total Bilirubin 0.20 - 1.20 mg/dL 1.12  Alkaline Phos 40 - 150 U/L 52  AST 5 - 34 U/L 18  ALT 0 - 55 U/L 13    Lab Results  Component Value Date   WBC 6.3 06/01/2016   HGB 14.0 06/01/2016   HCT 41.1 06/01/2016   MCV 89.6 06/01/2016   PLT 233 06/01/2016   NEUTROABS 4.0 06/01/2016    ASSESSMENT & PLAN:  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 breast of female, estrogen receptor positive ([place]) 07/14/2016: Right lumpectomy: 1.7 cm IDC grade 2 with calcifications, margins negative,PASH, additional inferior margin excision microscopic focus of residual cancer 0.4 cm, 0/4 lymph nodes negative, ER 70%, PR 90%, HER-2 negative, Ki-67 10%, T1 CN 0 stage IA Oncotype DX score 13:8% risk of distant recurrence with tamoxifen alone  Adjuvant radiation therapy [place] 08/19/2016 completed 10/11/2016  Treatment plan: Adjuvant antiestrogen therapy with tamoxifen 20 mg daily 10 years started  08/18/2016  Tamoxifen toxicities: Patient denies any hot flashes or myalgias.   Surveillance: 1. Breast exam: 12/31/2018: Benign 2. Mammogram6/15/2020: Benign, breast density category C 3.  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 She exercises very regularly and walks 2 to 4 miles every day.  We may get breast MRIs every couple of years. Return to clinic in 1 year for follow-up    No orders of the defined types were placed in this encounter.  The patient has a good understanding of the overall plan. she agrees with it. she will call with any problems that may develop before the next visit here.  Nicholas Lose, MD 12/31/2018  Julious Oka Dorshimer, am acting as scribe for Dr. Nicholas Lose.  I have reviewed the above document for accuracy and completeness, and I agree with the above.

## 2018-12-31 ENCOUNTER — Other Ambulatory Visit: Payer: Self-pay

## 2018-12-31 ENCOUNTER — Inpatient Hospital Stay: Payer: BC Managed Care – PPO | Attending: Hematology and Oncology | Admitting: Hematology and Oncology

## 2018-12-31 DIAGNOSIS — Z170 Estrogen receptor positive status [ER+]: Secondary | ICD-10-CM | POA: Diagnosis not present

## 2018-12-31 DIAGNOSIS — N6320 Unspecified lump in the left breast, unspecified quadrant: Secondary | ICD-10-CM | POA: Diagnosis not present

## 2018-12-31 DIAGNOSIS — Z79810 Long term (current) use of selective estrogen receptor modulators (SERMs): Secondary | ICD-10-CM | POA: Diagnosis not present

## 2018-12-31 DIAGNOSIS — C50511 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 female breast: Secondary | ICD-10-CM | POA: Diagnosis not present

## 2018-12-31 DIAGNOSIS — Z79899 Other long term (current) drug therapy: Secondary | ICD-10-CM | POA: Diagnosis not present

## 2018-12-31 DIAGNOSIS — Z923 Personal history of irradiation: Secondary | ICD-10-CM | POA: Diagnosis not present

## 2018-12-31 MED ORDER — TAMOXIFEN CITRATE 20 MG PO TABS: 20.0000 mg | ORAL_TABLET | Freq: Every day | ORAL | 3 refills | Status: DC

## 2018-12-31 NOTE — Assessment & Plan Note (Signed)
07/14/2016: Right lumpectomy: 1.7 cm IDC grade 2 with calcifications, margins negative,PASH, additional inferior margin excision microscopic focus of residual cancer 0.4 cm, 0/4 lymph nodes negative, ER 70%, PR 90%, HER-2 negative, Ki-67 10%, T1 CN 0 stage IA Oncotype DX score 13:8% risk of distant recurrence with tamoxifen alone  Adjuvant radiation therapy [place] 08/19/2016 completed 10/11/2016  Treatment plan: Adjuvant antiestrogen therapy with tamoxifen 20 mg daily 10 years started 08/18/2016  Tamoxifen toxicities: Patient denies any hot flashes or myalgias.   Surveillance: 1. Breast exam: 12/31/2018: Benign 2. Mammogram6/15/2020: Benign, breast density category C 3.  Breast MRI scheduled for 01/25/2019  We may get breast MRIs every couple of years. Return to clinic in 1 year for follow-up

## 2019-01-25 ENCOUNTER — Ambulatory Visit
Admission: RE | Admit: 2019-01-25 | Discharge: 2019-01-25 | Disposition: A | Discharge status: Home / Self Care | Payer: BC Managed Care – PPO | Source: Ambulatory Visit | Attending: Hematology and Oncology | Admitting: Hematology and Oncology

## 2019-01-25 DIAGNOSIS — Z1231 Encounter for screening mammogram for malignant neoplasm of breast: Secondary | ICD-10-CM

## 2019-01-25 MED ORDER — GADOBUTROL 1 MMOL/ML IV SOLN
7.0000 mL | Freq: Once | INTRAVENOUS | Status: AC | PRN
  Administered 2019-01-25: 12:00:00 7 mL via INTRAVENOUS

## 2019-01-28 ENCOUNTER — Telehealth: Payer: Self-pay | Admitting: Hematology and Oncology

## 2019-01-28 NOTE — Telephone Encounter (Signed)
Left voicemail that the breast MRI was normal

## 2019-04-18 ENCOUNTER — Inpatient Hospital Stay: Payer: BC Managed Care – PPO | Admitting: Hematology and Oncology

## 2019-06-20 ENCOUNTER — Other Ambulatory Visit: Payer: Self-pay | Admitting: Hematology and Oncology

## 2019-06-20 DIAGNOSIS — Z1231 Encounter for screening mammogram for malignant neoplasm of breast: Secondary | ICD-10-CM

## 2019-07-03 ENCOUNTER — Other Ambulatory Visit: Payer: Self-pay | Admitting: Hematology and Oncology

## 2019-07-03 DIAGNOSIS — Z853 Personal history of malignant neoplasm of breast: Secondary | ICD-10-CM

## 2019-07-05 ENCOUNTER — Ambulatory Visit
Admission: RE | Admit: 2019-07-05 | Discharge: 2019-07-05 | Disposition: A | Discharge status: Home / Self Care | Payer: BC Managed Care – PPO | Source: Ambulatory Visit | Attending: Hematology and Oncology | Admitting: Hematology and Oncology

## 2019-07-05 ENCOUNTER — Other Ambulatory Visit: Payer: Self-pay

## 2019-07-05 DIAGNOSIS — Z853 Personal history of malignant neoplasm of breast: Secondary | ICD-10-CM

## 2019-08-19 ENCOUNTER — Encounter: Payer: Self-pay | Admitting: Genetic Counselor

## 2019-12-30 NOTE — Progress Notes (Signed)
Patient Care Team: Dorothy Flow, MD as PCP - General (Family Medicine) Rolm Bookbinder, MD as Consulting Physician (General Surgery) Nicholas Lose, MD as Consulting Physician (Hematology and Oncology) Gery Pray, MD as Consulting Physician (Radiation Oncology)  DIAGNOSIS:    ICD-10-CM   1. 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 breast of female, estrogen receptor positive ([place])  C50.511    Z17.0     SUMMARY OF ONCOLOGIC HISTORY: Oncology History  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 breast of female, estrogen receptor positive ([place])  05/23/2016 Initial Diagnosis   Screening detected right breast distortion and left breast mass (fibroglandular tissue six-month follow-up recommended); ultrasound 1.1 cm right breast biopsy: Grade 2 invasive ductal carcinoma with DCIS, perineural invasion present, ER 70%, PR 90%, Ki-67 10%, HER-2 negative ratio 1.45, T1 BN 0 stage I a clinical stage   06/08/2016 Genetic Testing   Negative genetic testing on the [facility] STAT panel.  The STAT Breast cancer panel offered by [facility] includes sequencing and rearrangement analysis for the following 9 genes:  ATM, BRCA1, BRCA2, CDH1, CHEK2, PALB2, PTEN, STK11 and TP53.   The report date is Jun 08, 2016.    07/14/2016 Surgery   Right lumpectomy: 1.7 cm IDC grade 2 with calcifications, margins negative,PASH, additional inferior margin excision microscopic focus of residual cancer 0.4 cm, 0/4 lymph nodes negative, ER 70%, PR 90%, HER-2 negative, Ki-67 10%, T1 CN 0 stage IA   07/14/2016 Oncotype testing   Oncotype DX score 13: Risk of recurrence 8%   08/18/2016 - 10/11/2016 Radiation Therapy   Adjuvant [facility] at [facility] with Dr. Orlene Erm   08/18/2016 -  Anti-estrogen oral therapy   Tamoxifen 20 mg daily     CHIEF COMPLIANT: Follow-up of right breast cancer on tamoxifen therapy  INTERVAL HISTORY: Dorothy Crosby is a 44 y.o. with above-mentioned  history of right breast cancer treated with lumpectomy, radiation, and who is currently on tamoxifen. Mammogram on 07/05/19 showed no evidence of malignancy bilaterally.She presentstodayfor annual follow-up.  She denies any adverse effects to tamoxifen therapy.  Denies any hot flashes or cramps or any other side effects.  She continues to have menstrual cycles regularly.  ALLERGIES:  has No Known Allergies.  MEDICATIONS:  Current Outpatient Medications  Medication Sig Dispense Refill  . Multiple Vitamin (MULTIVITAMIN) tablet Take 1 tablet by mouth daily.    . tamoxifen (NOLVADEX) 20 MG tablet Take 1 tablet (20 mg total) by mouth daily. 90 tablet 3  . Vitamins-Lipotropics (B-50 COMPLEX) TABS Take 1 tablet by mouth daily.  0   No current facility-administered medications for this visit.    PHYSICAL EXAMINATION: ECOG PERFORMANCE STATUS: 1 - Symptomatic but completely ambulatory  Vitals:   12/31/19 1506  BP: 111/79  Pulse: 98  Resp: 19  Temp: 98.5 F (36.9 C)  SpO2: 100%   Filed Weights   12/31/19 1506  Weight: 155 lb 12.8 oz (70.7 kg)    BREAST: No palpable masses or nodules in either right or left breasts. No palpable axillary supraclavicular or infraclavicular adenopathy no breast tenderness or nipple discharge. (exam performed in the presence of a chaperone)  LABORATORY DATA:  I have reviewed the data as listed CMP Latest Ref Rng & Units 06/01/2016  Glucose 70 - 140 mg/dl 83  BUN 7.0 - 26.0 mg/dL 11.2  Creatinine 0.6 - 1.1 mg/dL 1.1  Sodium 136 - 145 mEq/L 143  Potassium 3.5 - 5.1 mEq/L 4.0  CO2 22 - 29  mEq/L 30(H)  Calcium 8.4 - 10.4 mg/dL 8.9  Total Protein 6.4 - 8.3 g/dL 7.2  Total Bilirubin 0.20 - 1.20 mg/dL 1.12  Alkaline Phos 40 - 150 U/L 52  AST 5 - 34 U/L 18  ALT 0 - 55 U/L 13    Lab Results  Component Value Date   WBC 6.3 06/01/2016   HGB 14.0 06/01/2016   HCT 41.1 06/01/2016   MCV 89.6 06/01/2016   PLT 233 06/01/2016   NEUTROABS 4.0 06/01/2016     ASSESSMENT & PLAN:  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 breast of female, estrogen receptor positive ([place]) 07/14/2016: Right lumpectomy: 1.7 cm IDC grade 2 with calcifications, margins negative,PASH, additional inferior margin excision microscopic focus of residual cancer 0.4 cm, 0/4 lymph nodes negative, ER 70%, PR 90%, HER-2 negative, Ki-67 10%, T1 CN 0 stage IA Oncotype DX score 13:8% risk of distant recurrence with tamoxifen alone  Adjuvant radiation therapy [place] 08/19/2016 completed 10/11/2016  Treatment plan: Adjuvant antiestrogen therapy with tamoxifen 20 mg daily 10 yearsstarted 08/18/2016  Tamoxifen toxicities: Patient denies any hot flashes or myalgias.   Surveillance: 1. Breast exam: 12/30/2008: Benign 2. Mammogram6/25/2021: Benign, breast density category C 3.  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 She exercises very regularly and walks 2 to 4 miles every day.  We may get breast MRIs every couple of years. Return to clinic in 1 year for follow-up    No orders of the defined types were placed in this encounter.  The patient has a good understanding of the overall plan. she agrees with it. she will call with any problems that may develop before the next visit here.  Total time spent: 20 mins including face to face time and time spent for planning, charting and coordination of care  Nicholas Lose, MD 12/31/2019  I, Cloyde Reams Dorshimer, am acting as scribe for Dr. Nicholas Lose.  I have reviewed the above documentation for accuracy and completeness, and I agree with the above.

## 2019-12-31 ENCOUNTER — Inpatient Hospital Stay: Payer: BC Managed Care – PPO | Attending: Hematology and Oncology | Admitting: Hematology and Oncology

## 2019-12-31 ENCOUNTER — Other Ambulatory Visit: Payer: Self-pay

## 2019-12-31 DIAGNOSIS — N6320 Unspecified lump in the left breast, unspecified quadrant: Secondary | ICD-10-CM | POA: Diagnosis not present

## 2019-12-31 DIAGNOSIS — C50511 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 female breast: Secondary | ICD-10-CM | POA: Diagnosis not present

## 2019-12-31 DIAGNOSIS — Z923 Personal history of irradiation: Secondary | ICD-10-CM | POA: Diagnosis not present

## 2019-12-31 DIAGNOSIS — Z79810 Long term (current) use of selective estrogen receptor modulators (SERMs): Secondary | ICD-10-CM | POA: Insufficient documentation

## 2019-12-31 DIAGNOSIS — Z170 Estrogen receptor positive status [ER+]: Secondary | ICD-10-CM | POA: Insufficient documentation

## 2019-12-31 DIAGNOSIS — N6489 Other specified disorders of breast: Secondary | ICD-10-CM | POA: Diagnosis not present

## 2019-12-31 MED ORDER — TAMOXIFEN CITRATE 20 MG PO TABS: 20.0000 mg | ORAL_TABLET | Freq: Every day | ORAL | 3 refills | Status: DC

## 2019-12-31 MED ORDER — FERROUS SULFATE 324 (65 FE) MG PO TBEC: 1.0000 | DELAYED_RELEASE_TABLET | ORAL | Status: AC

## 2019-12-31 NOTE — Assessment & Plan Note (Signed)
07/14/2016: Right lumpectomy: 1.7 cm IDC grade 2 with calcifications, margins negative,PASH, additional inferior margin excision microscopic focus of residual cancer 0.4 cm, 0/4 lymph nodes negative, ER 70%, PR 90%, HER-2 negative, Ki-67 10%, T1 CN 0 stage IA Oncotype DX score 13:8% risk of distant recurrence with tamoxifen alone  Adjuvant radiation therapy [place] 08/19/2016 completed 10/11/2016  Treatment plan: Adjuvant antiestrogen therapy with tamoxifen 20 mg daily 10 yearsstarted 08/18/2016  Tamoxifen toxicities: Patient denies any hot flashes or myalgias.   Surveillance: 1. Breast exam: 12/30/2008: Benign 2. Mammogram6/25/2021: Benign, breast density category C 3.  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 She exercises very regularly and walks 2 to 4 miles every day.  We may get breast MRIs every couple of years. Return to clinic in 1 year for follow-up

## 2020-05-12 ENCOUNTER — Other Ambulatory Visit: Payer: Self-pay | Admitting: Hematology and Oncology

## 2020-05-12 DIAGNOSIS — Z1231 Encounter for screening mammogram for malignant neoplasm of breast: Secondary | ICD-10-CM

## 2020-07-06 ENCOUNTER — Other Ambulatory Visit: Payer: Self-pay

## 2020-07-06 ENCOUNTER — Ambulatory Visit
Admission: RE | Admit: 2020-07-06 | Discharge: 2020-07-06 | Disposition: A | Discharge status: Home / Self Care | Payer: BC Managed Care – PPO | Source: Ambulatory Visit | Attending: Hematology and Oncology | Admitting: Hematology and Oncology

## 2020-07-06 DIAGNOSIS — Z1231 Encounter for screening mammogram for malignant neoplasm of breast: Secondary | ICD-10-CM

## 2020-07-08 ENCOUNTER — Other Ambulatory Visit: Payer: Self-pay | Admitting: Obstetrics and Gynecology

## 2020-07-08 DIAGNOSIS — R928 Other abnormal and inconclusive findings on diagnostic imaging of breast: Secondary | ICD-10-CM

## 2020-12-30 ENCOUNTER — Ambulatory Visit: Payer: BC Managed Care – PPO | Admitting: Hematology and Oncology

## 2021-01-07 ENCOUNTER — Ambulatory Visit: Payer: BC Managed Care – PPO | Admitting: Hematology and Oncology

## 2021-01-11 NOTE — Progress Notes (Addendum)
Patient Care Team: Mateo Flow, MD as PCP - General (Family Medicine) Rolm Bookbinder, MD as Consulting Physician (General Surgery) Nicholas Lose, MD as Consulting Physician (Hematology and Oncology) Gery Pray, MD as Consulting Physician (Radiation Oncology)  DIAGNOSIS:    ICD-10-CM   1. 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 breast of female, estrogen receptor positive ([place])  C50.511    Z17.0       SUMMARY OF ONCOLOGIC HISTORY: Oncology History  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 breast of female, estrogen receptor positive ([place])  05/23/2016 Initial Diagnosis   Screening detected right breast distortion and left breast mass (fibroglandular tissue six-month follow-up recommended); ultrasound 1.1 cm right breast biopsy: Grade 2 invasive ductal carcinoma with DCIS, perineural invasion present, ER 70%, PR 90%, Ki-67 10%, HER-2 negative ratio 1.45, T1 BN 0 stage I a clinical stage   06/08/2016 Genetic Testing   Negative genetic testing on the [facility] STAT panel.  The STAT Breast cancer panel offered by [facility] includes sequencing and rearrangement analysis for the following 9 genes:  ATM, BRCA1, BRCA2, CDH1, CHEK2, PALB2, PTEN, STK11 and TP53.   The report date is Jun 08, 2016.    07/14/2016 Surgery   Right lumpectomy: 1.7 cm IDC grade 2 with calcifications, margins negative,PASH, additional inferior margin excision microscopic focus of residual cancer 0.4 cm, 0/4 lymph nodes negative, ER 70%, PR 90%, HER-2 negative, Ki-67 10%, T1 CN 0 stage IA   07/14/2016 Oncotype testing   Oncotype DX score 13: Risk of recurrence 8%   08/18/2016 - 10/11/2016 Radiation Therapy   Ad[facility] at [facility] with Dr. Orlene Erm   08/18/2016 -  Anti-estrogen oral therapy   Tamoxifen 20 mg daily     CHIEF COMPLIANT: Follow-up of right breast cancer on tamoxifen therapy  INTERVAL HISTORY: Dorothy Crosby is a 46 y.o. with above-mentioned  history of right breast cancer treated with lumpectomy, radiation, and who is currently on tamoxifen. Mammogram on 07/06/2020 showed no evidence of malignancy bilaterally. She presents today for annual follow-up.  She has lost 18 pounds in the last 6 months by watching her diet and giving up carbohydrates.  She feels great.  She is also been walking 6 miles per day.  ALLERGIES:  has No Known Allergies.  MEDICATIONS:  Current Outpatient Medications  Medication Sig Dispense Refill   ferrous sulfate 324 (65 Fe) MG TBEC Take 1 tablet (325 mg total) by mouth once a week. 30 tablet    Multiple Vitamin (MULTIVITAMIN) tablet Take 1 tablet by mouth daily.     tamoxifen (NOLVADEX) 20 MG tablet Take 1 tablet (20 mg total) by mouth daily. 90 tablet 3   Vitamins-Lipotropics (B-50 COMPLEX) TABS Take 1 tablet by mouth daily.  0   No current facility-administered medications for this visit.    PHYSICAL EXAMINATION: ECOG PERFORMANCE STATUS: 1 - Symptomatic but completely ambulatory  Vitals:   01/12/21 1440  BP: (!) 100/55  Pulse: 89  Resp: 16  Temp: 97.7 F (36.5 C)  SpO2: 100%   Filed Weights   01/12/21 1440  Weight: 140 lb 1.6 oz (63.5 kg)    BREAST: No palpable masses or nodules in either right or left breasts. No palpable axillary supraclavicular or infraclavicular adenopathy no breast tenderness or nipple discharge. (exam performed in the presence of a chaperone)  LABORATORY DATA:  I have reviewed the data as listed CMP Latest Ref Rng & Units 06/01/2016  Glucose 70 - 140 mg/dl 83  BUN 7.0 - 26.0 mg/dL 11.2  Creatinine 0.6 - 1.1 mg/dL 1.1  Sodium 136 - 145 mEq/L 143  Potassium 3.5 - 5.1 mEq/L 4.0  CO2 22 - 29 mEq/L 30(H)  Calcium 8.4 - 10.4 mg/dL 8.9  Total Protein 6.4 - 8.3 g/dL 7.2  Total Bilirubin 0.20 - 1.20 mg/dL 1.12  Alkaline Phos 40 - 150 U/L 52  AST 5 - 34 U/L 18  ALT 0 - 55 U/L 13    Lab Results  Component Value Date   WBC 6.3 06/01/2016   HGB 14.0 06/01/2016   HCT  41.1 06/01/2016   MCV 89.6 06/01/2016   PLT 233 06/01/2016   NEUTROABS 4.0 06/01/2016    ASSESSMENT & PLAN:  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 breast of female, estrogen receptor positive ([place]) 07/14/2016: Right lumpectomy: 1.7 cm IDC grade 2 with calcifications, margins negative,PASH, additional inferior margin excision microscopic focus of residual cancer 0.4 cm, 0/4 lymph nodes negative, ER 70%, PR 90%, HER-2 negative, Ki-67 10%, T1 CN 0 stage IA Oncotype DX score 13:8% risk of distant recurrence with tamoxifen alone   Adjuvant radiation therapy [place] 08/19/2016 completed 10/11/2016   Treatment plan: Adjuvant antiestrogen therapy with tamoxifen 20 mg daily 10 years started 08/18/2016   Tamoxifen toxicities: Patient denies any hot flashes or myalgias.     Surveillance: 1. Breast exam: 12/30/2008: Benign 2. Mammogram  07/07/20: Benign, breast density category C 3.  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 She exercises very regularly and walks 6 miles every day. She lost 18 pounds over the past 6 months.  She plans to lose another 18 pounds in the next 6 months.  She gave up carbohydrates and sodas.   We may get breast MRIs every couple of years. We will run breast cancer index to determine if she would benefit from extended adjuvant therapy. Return to clinic in 1 year for follow-up    No orders of the defined types were placed in this encounter.  The patient has a good understanding of the overall plan. she agrees with it. she will call with any problems that may develop before the next visit here.  Total time spent: 20 mins including face to face time and time spent for planning, charting and coordination of care  Rulon Eisenmenger, MD, MPH 01/12/2021  I, Thana Ates, am acting as scribe for Dr. Nicholas Lose.  I have reviewed the above documentation for  accuracy and completeness, and I agree with the above.

## 2021-01-12 ENCOUNTER — Other Ambulatory Visit: Payer: Self-pay

## 2021-01-12 ENCOUNTER — Inpatient Hospital Stay: Payer: BC Managed Care – PPO | Attending: Hematology and Oncology | Admitting: Hematology and Oncology

## 2021-01-12 DIAGNOSIS — N6320 Unspecified lump in the left breast, unspecified quadrant: Secondary | ICD-10-CM | POA: Diagnosis not present

## 2021-01-12 DIAGNOSIS — N6489 Other specified disorders of breast: Secondary | ICD-10-CM | POA: Insufficient documentation

## 2021-01-12 DIAGNOSIS — Z79899 Other long term (current) drug therapy: Secondary | ICD-10-CM | POA: Diagnosis not present

## 2021-01-12 DIAGNOSIS — Z170 Estrogen receptor positive status [ER+]: Secondary | ICD-10-CM | POA: Diagnosis not present

## 2021-01-12 DIAGNOSIS — C50511 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 female breast: Secondary | ICD-10-CM | POA: Insufficient documentation

## 2021-01-12 DIAGNOSIS — Z79810 Long term (current) use of selective estrogen receptor modulators (SERMs): Secondary | ICD-10-CM | POA: Diagnosis not present

## 2021-01-12 DIAGNOSIS — Z923 Personal history of irradiation: Secondary | ICD-10-CM | POA: Insufficient documentation

## 2021-01-12 MED ORDER — TAMOXIFEN CITRATE 20 MG PO TABS: 20.0000 mg | ORAL_TABLET | Freq: Every day | ORAL | 3 refills | Status: DC

## 2021-01-12 MED ORDER — CALCIUM CARBONATE 600 MG PO TABS: 600.0000 mg | ORAL_TABLET | Freq: Two times a day (BID) | ORAL | Status: AC

## 2021-01-12 MED ORDER — B-12 500 MCG PO TABS: 1.0000 ug | ORAL_TABLET | Freq: Every day | ORAL | Status: AC

## 2021-01-12 NOTE — Assessment & Plan Note (Signed)
07/14/2016: Right lumpectomy: 1.7 cm IDC grade 2 with calcifications, margins negative,PASH, additional inferior margin excision microscopic focus of residual cancer 0.4 cm, 0/4 lymph nodes negative, ER 70%, PR 90%, HER-2 negative, Ki-67 10%, T1 CN 0 stage IA Oncotype DX score 13:8% risk of distant recurrence with tamoxifen alone  Adjuvant radiation therapy [place] 08/19/2016 completed 10/11/2016  Treatment plan: Adjuvant antiestrogen therapy with tamoxifen 20 mg daily 10 yearsstarted 08/18/2016  Tamoxifen toxicities: Patient denies any hot flashes or myalgias.   Surveillance: 1. Breast exam: 12/30/2008: Benign 2. Mammogram 07/07/20: Benign, breast density category C 3.  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 She lost 18 pounds over the past 6 months.  She plans to lose another 18 pounds in the next 6 months.  She gave up carbohydrates and sodas.  We may get breast MRIs every couple of years. Return to clinic in 1 year for follow-up

## 2021-01-13 ENCOUNTER — Telehealth: Payer: Self-pay | Admitting: *Deleted

## 2021-01-13 NOTE — Telephone Encounter (Signed)
Ordered [facility] per Dr. Lindi Adie. Faxed requisition to pathology and [facility].

## 2021-02-01 ENCOUNTER — Telehealth: Payer: Self-pay | Admitting: Hematology and Oncology

## 2021-02-01 NOTE — Telephone Encounter (Signed)
I discussed the result of breast cancer index which is a molecular test to predict who needs extended endocrine therapy.  It came back that she does not need to extend endocrine therapy beyond 5 years. High risk of late distant recurrence: 1.7% (between year 5 to year 10) She will finish antiestrogen therapy by end of July 2023.

## 2021-02-03 ENCOUNTER — Encounter: Payer: Self-pay | Admitting: Hematology and Oncology

## 2021-02-04 ENCOUNTER — Encounter (HOSPITAL_COMMUNITY): Payer: Self-pay

## 2021-05-20 ENCOUNTER — Other Ambulatory Visit: Payer: Self-pay | Admitting: Hematology and Oncology

## 2021-05-20 DIAGNOSIS — Z1231 Encounter for screening mammogram for malignant neoplasm of breast: Secondary | ICD-10-CM

## 2021-07-07 ENCOUNTER — Ambulatory Visit
Admission: RE | Admit: 2021-07-07 | Discharge: 2021-07-07 | Disposition: A | Discharge status: Home / Self Care | Payer: BC Managed Care – PPO | Source: Ambulatory Visit | Attending: Hematology and Oncology | Admitting: Hematology and Oncology

## 2021-07-07 DIAGNOSIS — Z1231 Encounter for screening mammogram for malignant neoplasm of breast: Secondary | ICD-10-CM

## 2022-01-05 ENCOUNTER — Telehealth: Payer: Self-pay | Admitting: Hematology and Oncology

## 2022-01-05 NOTE — Telephone Encounter (Signed)
Rescheduled appointment per provider on call. Left voicemail.  

## 2022-01-13 ENCOUNTER — Ambulatory Visit: Payer: BC Managed Care – PPO | Admitting: Hematology and Oncology

## 2022-01-24 ENCOUNTER — Telehealth: Payer: Self-pay | Admitting: Hematology and Oncology

## 2022-01-24 NOTE — Telephone Encounter (Signed)
Rescheduled appointment per provider admin. Patient is aware of the changes made to her upcoming appointment.

## 2022-01-27 ENCOUNTER — Inpatient Hospital Stay: Payer: BC Managed Care – PPO | Attending: Hematology and Oncology | Admitting: Hematology and Oncology

## 2022-01-27 ENCOUNTER — Other Ambulatory Visit: Payer: Self-pay

## 2022-01-27 VITALS — BP 95/51 | HR 76 | Temp 97.5°F | Resp 18 | Wt 140.3 lb

## 2022-01-27 DIAGNOSIS — C50511 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 female breast: Secondary | ICD-10-CM | POA: Diagnosis present

## 2022-01-27 DIAGNOSIS — Z79899 Other long term (current) drug therapy: Secondary | ICD-10-CM | POA: Diagnosis not present

## 2022-01-27 DIAGNOSIS — Z79810 Long term (current) use of selective estrogen receptor modulators (SERMs): Secondary | ICD-10-CM | POA: Diagnosis not present

## 2022-01-27 DIAGNOSIS — Z170 Estrogen receptor positive status [ER+]: Secondary | ICD-10-CM | POA: Diagnosis not present

## 2022-01-27 NOTE — Assessment & Plan Note (Signed)
07/14/2016: Right lumpectomy: 1.7 cm IDC grade 2 with calcifications, margins negative,PASH, additional inferior margin excision microscopic focus of residual cancer 0.4 cm, 0/4 lymph nodes negative, ER 70%, PR 90%, HER-2 negative, Ki-67 10%, T1 CN 0 stage IA Oncotype DX score 13:8% risk of distant recurrence with tamoxifen alone   Adjuvant radiation therapy [place] 08/19/2016 completed 10/11/2016   Treatment plan: Adjuvant antiestrogen therapy with tamoxifen 20 mg daily 5 years started 08/18/2016 completed August 2023 (breast cancer index did not reveal any benefit to extended endocrine therapy)   Tamoxifen toxicities: Patient denies any hot flashes or myalgias.     Surveillance: 1. Breast exam: 01/27/2022: Benign 2. Mammogram 07/08/2021: Benign, breast density category C 3.  Breast MRI 01/25/2019: Benign breast density category C   She is a high school teacher   She exercises very regularly and walks 6 miles every day. She lost 30 pounds by eating healthy     Return to clinic on an as-needed basis

## 2022-01-27 NOTE — Progress Notes (Signed)
Patient Care Team: Mateo Flow, MD as PCP - General (Family Medicine) Rolm Bookbinder, MD as Consulting Physician (General Surgery) Nicholas Lose, MD as Consulting Physician (Hematology and Oncology) Gery Pray, MD as Consulting Physician (Radiation Oncology)  DIAGNOSIS:  Encounter Diagnosis  Name Primary?   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 breast of female, estrogen receptor positive ([place]) Yes    SUMMARY OF ONCOLOGIC HISTORY: Oncology History  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 breast of female, estrogen receptor positive ([place])  05/23/2016 Initial Diagnosis   Screening detected right breast distortion and left breast mass (fibroglandular tissue six-month follow-up recommended); ultrasound 1.1 cm right breast biopsy: Grade 2 invasive ductal carcinoma with DCIS, perineural invasion present, ER 70%, PR 90%, Ki-67 10%, HER-2 negative ratio 1.45, T1 BN 0 stage I a clinical stage   06/08/2016 Genetic Testing   Negative genetic testing on the [facility] STAT panel.  The STAT Breast cancer panel offered by [facility] includes sequencing and rearrangement analysis for the following 9 genes:  ATM, BRCA1, BRCA2, CDH1, CHEK2, PALB2, PTEN, STK11 and TP53.   The report date is Jun 08, 2016.    07/14/2016 Surgery   Right lumpectomy: 1.7 cm IDC grade 2 with calcifications, margins negative,PASH, additional inferior margin excision microscopic focus of residual cancer 0.4 cm, 0/4 lymph nodes negative, ER 70%, PR 90%, HER-2 negative, Ki-67 10%, T1 CN 0 stage IA   07/14/2016 Oncotype testing   Oncotype DX score 13: Risk of recurrence 8%   08/18/2016 - 10/11/2016 Radiation Therapy   Adjuvant radiation therapy at [facility] with Dr. Orlene Erm   08/18/2016 -  Anti-estrogen oral therapy   Tamoxifen 20 mg daily     CHIEF COMPLIANT: Follow-up of right breast cancer on tamoxifen therapy    INTERVAL HISTORY: Dorothy Crosby is a 47 y.o. with  above-mentioned history of right breast cancer treated with lumpectomy, radiation, and who is currently on tamoxifen. She presents to the clinic for a follow-up. She reports that she changed her diet she gave up caffeine and red meat and she has lost 30 pounds. She states she feels better since she changed the way she eats.    ALLERGIES:  has No Known Allergies.  MEDICATIONS:  Current Outpatient Medications  Medication Sig Dispense Refill   calcium carbonate (CALCIUM 600) 600 MG TABS tablet Take 1 tablet (600 mg total) by mouth 2 (two) times daily with a meal. 30 tablet    Cyanocobalamin (B-12) 500 MCG TABS Take 1 mcg by mouth daily. 30 tablet    ferrous sulfate 324 (65 Fe) MG TBEC Take 1 tablet (325 mg total) by mouth once a week. 30 tablet    Multiple Vitamin (MULTIVITAMIN) tablet Take 1 tablet by mouth daily.     No current facility-administered medications for this visit.    PHYSICAL EXAMINATION: ECOG PERFORMANCE STATUS: 1 - Symptomatic but completely ambulatory  Vitals:   01/27/22 1514  BP: (!) 95/51  Pulse: 76  Resp: 18  Temp: (!) 97.5 F (36.4 C)  SpO2: 100%   Filed Weights   01/27/22 1514  Weight: 140 lb 5 oz (63.6 kg)    BREAST: No palpable masses or nodules in either right or left breasts. No palpable axillary supraclavicular or infraclavicular adenopathy no breast tenderness or nipple discharge. (exam performed in the presence of a chaperone)  LABORATORY DATA:  I have reviewed the data as listed    Latest Ref Rng & Units 06/01/2016   12:11 PM  CMP  Glucose 70 - 140 mg/dl 83   BUN 7.0 - 26.0 mg/dL 11.2   Creatinine 0.6 - 1.1 mg/dL 1.1   Sodium 136 - 145 mEq/L 143   Potassium 3.5 - 5.1 mEq/L 4.0   CO2 22 - 29 mEq/L 30   Calcium 8.4 - 10.4 mg/dL 8.9   Total Protein 6.4 - 8.3 g/dL 7.2   Total Bilirubin 0.20 - 1.20 mg/dL 1.12   Alkaline Phos 40 - 150 U/L 52   AST 5 - 34 U/L 18   ALT 0 - 55 U/L 13     Lab Results  Component Value Date   WBC 6.3 06/01/2016    HGB 14.0 06/01/2016   HCT 41.1 06/01/2016   MCV 89.6 06/01/2016   PLT 233 06/01/2016   NEUTROABS 4.0 06/01/2016    ASSESSMENT & PLAN:  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 breast of female, estrogen receptor positive ([employer]) 07/14/2016: Right lumpectomy: 1.7 cm IDC grade 2 with calcifications, margins negative,PASH, additional inferior margin excision microscopic focus of residual cancer 0.4 cm, 0/4 lymph nodes negative, ER 70%, PR 90%, HER-2 negative, Ki-67 10%, T1 CN 0 stage IA Oncotype DX score 13:8% risk of distant recurrence with tamoxifen alone   Adjuvant radiation therapy [place] 08/19/2016 completed 10/11/2016   Treatment plan: Adjuvant antiestrogen therapy with tamoxifen 20 mg daily 5 years started 08/18/2016 completed August 2023 (breast cancer index did not reveal any benefit to extended endocrine therapy)   Tamoxifen toxicities: Patient denies any hot flashes or myalgias.     Surveillance: 1. Breast exam: 01/27/2022: Benign 2. Mammogram 07/08/2021: Benign, breast density category C 3.  Breast MRI 01/25/2019: Benign breast density category C   She is a high school teacher   She exercises very regularly and walks 6 miles every day. She lost 30 pounds by eating healthy     Return to clinic on an as-needed basis    No orders of the defined types were placed in this encounter.  The patient has a good understanding of the overall plan. she agrees with it. she will call with any problems that may develop before the next visit here. Total time spent: 30 mins including face to face time and time spent for planning, charting and co-ordination of care   Harriette Ohara, MD 01/27/22    I Gardiner Coins am acting as a Education administrator for [employer]  I have reviewed the above documentation for accuracy and completeness, and I agree with the above.

## 2022-01-28 ENCOUNTER — Ambulatory Visit: Payer: BC Managed Care – PPO | Admitting: Hematology and Oncology

## 2022-05-23 ENCOUNTER — Other Ambulatory Visit: Payer: Self-pay | Admitting: Hematology and Oncology

## 2022-05-23 DIAGNOSIS — Z1231 Encounter for screening mammogram for malignant neoplasm of breast: Secondary | ICD-10-CM

## 2022-07-11 ENCOUNTER — Ambulatory Visit
Admission: RE | Admit: 2022-07-11 | Discharge: 2022-07-11 | Disposition: A | Discharge status: Home / Self Care | Payer: BC Managed Care – PPO | Source: Ambulatory Visit | Attending: Hematology and Oncology | Admitting: Hematology and Oncology

## 2022-07-11 DIAGNOSIS — Z1231 Encounter for screening mammogram for malignant neoplasm of breast: Secondary | ICD-10-CM

## 2023-05-15 ENCOUNTER — Other Ambulatory Visit: Payer: Self-pay | Admitting: Obstetrics and Gynecology

## 2023-05-15 DIAGNOSIS — Z1231 Encounter for screening mammogram for malignant neoplasm of breast: Secondary | ICD-10-CM

## 2023-07-12 ENCOUNTER — Ambulatory Visit
Admission: RE | Admit: 2023-07-12 | Discharge: 2023-07-12 | Disposition: A | Discharge status: Home / Self Care | Payer: Self-pay | Source: Ambulatory Visit | Attending: Obstetrics and Gynecology | Admitting: Obstetrics and Gynecology

## 2023-07-12 DIAGNOSIS — Z1231 Encounter for screening mammogram for malignant neoplasm of breast: Secondary | ICD-10-CM

## 2023-07-12 HISTORY — DX: Personal history of irradiation: Z92.3

## 2023-07-18 ENCOUNTER — Other Ambulatory Visit: Payer: Self-pay | Admitting: Obstetrics and Gynecology

## 2023-07-18 DIAGNOSIS — R928 Other abnormal and inconclusive findings on diagnostic imaging of breast: Secondary | ICD-10-CM

## 2023-07-25 ENCOUNTER — Ambulatory Visit
Admission: RE | Admit: 2023-07-25 | Discharge: 2023-07-25 | Disposition: A | Discharge status: Home / Self Care | Source: Ambulatory Visit | Attending: Obstetrics and Gynecology | Admitting: Obstetrics and Gynecology

## 2023-07-25 DIAGNOSIS — R928 Other abnormal and inconclusive findings on diagnostic imaging of breast: Secondary | ICD-10-CM
